# Patient Record
Sex: Female | Born: 1945 | Race: White | Hispanic: No | Smoking: Former smoker
Health system: Southern US, Community
[De-identification: ages and names within clinical notes are randomized; demographics above are authoritative.]

## PROBLEM LIST (undated history)

## (undated) DIAGNOSIS — I499 Cardiac arrhythmia, unspecified: Secondary | ICD-10-CM

## (undated) DIAGNOSIS — R011 Cardiac murmur, unspecified: Secondary | ICD-10-CM

## (undated) DIAGNOSIS — E785 Hyperlipidemia, unspecified: Secondary | ICD-10-CM

## (undated) DIAGNOSIS — G4700 Insomnia, unspecified: Secondary | ICD-10-CM

## (undated) DIAGNOSIS — M1990 Unspecified osteoarthritis, unspecified site: Secondary | ICD-10-CM

## (undated) DIAGNOSIS — Z85828 Personal history of other malignant neoplasm of skin: Secondary | ICD-10-CM

## (undated) HISTORY — PX: APPENDECTOMY: SHX54

## (undated) HISTORY — PX: TONSILLECTOMY: SUR1361

---

## 2005-03-20 ENCOUNTER — Ambulatory Visit: Payer: Self-pay | Admitting: Family Medicine

## 2007-07-28 ENCOUNTER — Ambulatory Visit: Payer: Self-pay | Admitting: Family Medicine

## 2010-08-07 ENCOUNTER — Ambulatory Visit: Payer: Self-pay | Admitting: Family Medicine

## 2012-09-28 ENCOUNTER — Ambulatory Visit: Payer: Self-pay | Admitting: Family Medicine

## 2013-07-11 ENCOUNTER — Ambulatory Visit: Payer: Self-pay | Admitting: Physical Medicine and Rehabilitation

## 2013-10-17 ENCOUNTER — Other Ambulatory Visit: Payer: Self-pay | Admitting: Orthopedic Surgery

## 2013-10-24 ENCOUNTER — Other Ambulatory Visit: Payer: Self-pay | Admitting: Orthopedic Surgery

## 2013-10-24 ENCOUNTER — Encounter (HOSPITAL_COMMUNITY): Payer: Self-pay | Admitting: Pharmacy Technician

## 2013-10-24 NOTE — H&P (Signed)
Chloe Snyder is an 67 y.o. female.   Chief Complaint: back and left leg pain HPI: The patient reports low back symptoms including pain which began 4 month(s) ago without any known injury. and Symptoms include pain (left low back and buttock). The pain radiates to the left lateral thigh and left lateral lower leg. The patient describes the severity of their symptoms as severe. Symptoms are exacerbated by standing (and walking). Prior to being seen today the patient was previously evaluated by Dr. Chasnis, pain management with [facility] in [place]. Past evaluation has included MRI of the lumbar spine. Past treatment has included opioid analgesics (Hydrocodone 7.5/325mg), corticosteroids, epidural injections (@ L4-L5. The patient states that she did not get any relief from the injection.), ice packs, heating pad, chiropractic manipulation and acupuncture.  She presents here with his daughter who is a workman's compensation adjuster. She has had an MRI of her back indicating a disk herniation paracentral to the left with mass effect upon the L4 nerve root. She reports pain that radiates down into the top of her foot. She had an epidural steroid injection that apparently has given her no relief. She is also taking pain medicine. It does not help. She would like her leg pain to be fixed. She has tried physical therapy in terms of chiropractic Acupuncture. The patient's pain range is organic. She rates her pain as 8/10.  No past medical history on file.  No past surgical history on file.  No family history on file. Social History:  has no tobacco, alcohol, and drug history on file.  Allergies: No Known Allergies   (Not in a hospital admission)  No results found for this or any previous visit (from the past 48 hour(s)). No results found.  Review of Systems  Constitutional: Negative.   HENT: Negative.   Eyes: Negative.   Respiratory: Negative.   Cardiovascular: Negative.    Gastrointestinal: Negative.   Genitourinary: Negative.   Musculoskeletal: Positive for back pain.  Skin: Negative.   Neurological: Positive for sensory change and focal weakness.  Endo/Heme/Allergies: Negative.   Psychiatric/Behavioral: Negative.     There were no vitals taken for this visit. Physical Exam  Constitutional: She is oriented to person, place, and time. She appears well-developed and well-nourished.  HENT:  Head: Normocephalic and atraumatic.  Eyes: Conjunctivae and EOM are normal. Pupils are equal, round, and reactive to light.  Neck: Normal range of motion. Neck supple.  Cardiovascular: Normal rate and regular rhythm.   Respiratory: Effort normal and breath sounds normal.  GI: Soft. Bowel sounds are normal.  Musculoskeletal:  Upright, severe distress. Mood and affect are appropriate. Straight leg raise with buttock, thigh, and calf pain on the left, negative on the right exacerbated with gross augmentation maneuver. EHL and dorsiflexion is 4/5. Slightly altered sensation L5 dermatome. Slight quadriceps weakness is noted as well. Pain with extension and flexion.  Lumbar spine exam reveals no evidence of soft tissue swelling, deformity or skin ecchymosis. On palpation there is no tenderness of the lumbar spine. The abdomen is soft and nontender. Nontender over the trochanters. No cellulitis or lymphadenopathy.  Good range of motion of the lumbar spine without associated pain. Motor is 5/5 including tibialis anterior, plantar flexion, quadriceps and hamstrings. Patient is normoreflexic. There is no Babinski or clonus. Sensory exam is intact to light touch. Patient has good distal pulses. No DVT. No pain and normal range of motion without instability of the hips, knees and ankles.  Neurological: She   is alert and oriented to person, place, and time. She has normal reflexes.  Skin: Skin is warm and dry.    Three views of the lumbar spine, AP, lateral flexion and extension  demonstrates disk degeneration particularly at 4-5, also at 5-1. No instability in flexion and extension. Hips are unremarkable.  MRI - paracentral disk herniation, disk degeneration pressing on the 4-5 roots.  Assessment/Plan 1. 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 2. Tobacco dependence.  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 Reviewed the MRI, x-rays, illustrated handouts with she and her daughter. Certainly an option at this point in time would be a lumbar microdecompression. However my concern is the duration of the neurocompression in the presence of neurologic deficit and may get no relief and/or facing a battered root syndrome. However certainly she still has a neurocompressive lesion and has made no progress. Proceeding then is reasonable. Another concern is tobacco use. Indicated dilatory side effects including scar tissue, infection, recurrent disk herniation, persistent pain. She indicates she would not quit and has not in the past. I feel that again it would be imperative and I strongly encourage her to commit as the outcome especially with the epidural fibrosis and the inability to readdress that surgically is a high risk. We won't proceed accordingly. Need preoperative clearance. No chest pain or shortness of breath. I appreciate the kind referral.  I had an extensive discussion of the risks and benefits of the lumbar decompression with the patient including bleeding, infection, damage to neurovascular structures, epidural fibrosis, CSF leak requiring repair. We also discussed increase in pain, adjacent segment disease, recurrent disc herniation, need for future surgery including repeat decompression and/or fusion. We also discussed risks of  postoperative hematoma, paralysis, anesthetic complications including DVT, PE, death, cardiopulmonary dysfunction. In addition, the perioperative and postoperative courses were discussed in detail including the rehabilitative time and return to functional activity and work. I provided the patient with an illustrated handout and utilized the appropriate surgical models.  Plan microlumbar decompression L4-5  BISSELL, JACLYN M. for Dr. Beane 10/24/2013, 2:42 PM    

## 2013-10-27 ENCOUNTER — Encounter (HOSPITAL_COMMUNITY)
Admission: RE | Admit: 2013-10-27 | Discharge: 2013-10-27 | Disposition: A | Discharge status: Home / Self Care | Payer: Medicare FFS | Source: Ambulatory Visit | Attending: Specialist | Admitting: Specialist

## 2013-10-27 ENCOUNTER — Ambulatory Visit (HOSPITAL_COMMUNITY)
Admission: RE | Admit: 2013-10-27 | Discharge: 2013-10-27 | Disposition: A | Discharge status: Home / Self Care | Payer: Medicare FFS | Source: Ambulatory Visit | Attending: Orthopedic Surgery | Admitting: Orthopedic Surgery

## 2013-10-27 ENCOUNTER — Encounter (HOSPITAL_COMMUNITY): Payer: Self-pay

## 2013-10-27 ENCOUNTER — Encounter (INDEPENDENT_AMBULATORY_CARE_PROVIDER_SITE_OTHER): Payer: Self-pay

## 2013-10-27 DIAGNOSIS — M948X9 Other specified disorders of cartilage, unspecified sites: Secondary | ICD-10-CM | POA: Insufficient documentation

## 2013-10-27 DIAGNOSIS — I700 Atherosclerosis of aorta: Secondary | ICD-10-CM | POA: Insufficient documentation

## 2013-10-27 DIAGNOSIS — I7090 Unspecified atherosclerosis: Secondary | ICD-10-CM | POA: Insufficient documentation

## 2013-10-27 DIAGNOSIS — M5126 Other intervertebral disc displacement, lumbar region: Secondary | ICD-10-CM | POA: Insufficient documentation

## 2013-10-27 DIAGNOSIS — M51379 Other intervertebral disc degeneration, lumbosacral region without mention of lumbar back pain or lower extremity pain: Secondary | ICD-10-CM | POA: Insufficient documentation

## 2013-10-27 DIAGNOSIS — Z01812 Encounter for preprocedural laboratory examination: Secondary | ICD-10-CM | POA: Insufficient documentation

## 2013-10-27 DIAGNOSIS — Z01818 Encounter for other preprocedural examination: Secondary | ICD-10-CM | POA: Insufficient documentation

## 2013-10-27 DIAGNOSIS — M5137 Other intervertebral disc degeneration, lumbosacral region: Secondary | ICD-10-CM | POA: Insufficient documentation

## 2013-10-27 HISTORY — DX: Unspecified osteoarthritis, unspecified site: M19.90

## 2013-10-27 HISTORY — DX: Cardiac arrhythmia, unspecified: I49.9

## 2013-10-27 HISTORY — DX: Hyperlipidemia, unspecified: E78.5

## 2013-10-27 HISTORY — DX: Insomnia, unspecified: G47.00

## 2013-10-27 HISTORY — DX: Personal history of other malignant neoplasm of skin: Z85.828

## 2013-10-27 HISTORY — DX: Cardiac murmur, unspecified: R01.1

## 2013-10-27 LAB — CBC
HCT: 41.5 % (ref 36.0–46.0)
Hemoglobin: 14.2 g/dL (ref 12.0–15.0)
MCH: 31.8 pg (ref 26.0–34.0)
MCHC: 34.2 g/dL (ref 30.0–36.0)
MCV: 93 fL (ref 78.0–100.0)
Platelets: 235 10*3/uL (ref 150–400)
RBC: 4.46 MIL/uL (ref 3.87–5.11)
RDW: 12.1 % (ref 11.5–15.5)
WBC: 7.1 10*3/uL (ref 4.0–10.5)

## 2013-10-27 LAB — SURGICAL PCR SCREEN
MRSA, PCR: NEGATIVE
STAPHYLOCOCCUS AUREUS: NEGATIVE

## 2013-10-27 LAB — BASIC METABOLIC PANEL
BUN: 8 mg/dL (ref 6–23)
CO2: 26 mEq/L (ref 19–32)
CREATININE: 0.86 mg/dL (ref 0.50–1.10)
Calcium: 9.4 mg/dL (ref 8.4–10.5)
Chloride: 102 mEq/L (ref 96–112)
GFR, EST AFRICAN AMERICAN: 80 mL/min — AB (ref >90)
GFR, EST NON AFRICAN AMERICAN: 69 mL/min — AB (ref >90)
Glucose, Bld: 102 mg/dL — ABNORMAL HIGH (ref 70–99)
Potassium: 4.3 mEq/L (ref 3.7–5.3)
Sodium: 140 mEq/L (ref 137–147)

## 2013-10-27 NOTE — Patient Instructions (Signed)
Chloe Snyder  04/01/2092                           YOUR PROCEDURE IS SCHEDULED ON: 11/01/13               PLEASE REPORT TO SHORT STAY CENTER AT : 8:30 AM               CALL THIS NUMBER IF ANY PROBLEMS THE DAY OF SURGERY :               [phone]                      REMEMBER:   Do not eat food or drink liquids AFTER MIDNIGHT   Take these medicines the morning of surgery with A SIP OF WATER:  CRESTOR   Do not wear jewelry, make-up   Do not wear lotions, powders, or perfumes.   Do not shave legs or underarms 12 hrs. before surgery (men may shave face)  Do not bring valuables to the hospital.  Contacts, dentures or bridgework may not be worn into surgery.  Leave suitcase in the car. After surgery it may be brought to your room.  For patients admitted to the hospital more than one night, checkout time is 11:00                          The day of discharge.   Patients discharged the day of surgery will not be allowed to drive home                             If going home same day of surgery, must have someone stay with you first                           24 hrs at home and arrange for some one to drive you home from hospital.    Special Instructions:   Please read over the following fact sheets that you were given:               1. MRSA  INFORMATION                      2. [place]                                                X_____________________________________________________________________        Failure to follow these instructions may result in cancellation of your surgery

## 2013-10-27 NOTE — Progress Notes (Signed)
EKG / ECHO / CARDIOLOGY NOTES ON CHART

## 2013-11-01 ENCOUNTER — Encounter (HOSPITAL_COMMUNITY): Payer: Self-pay | Admitting: *Deleted

## 2013-11-01 ENCOUNTER — Observation Stay (HOSPITAL_COMMUNITY)
Admission: RE | Admit: 2013-11-01 | Discharge: 2013-11-02 | Disposition: A | Discharge status: Home / Self Care | Payer: Medicare FFS | Source: Ambulatory Visit | Attending: Specialist | Admitting: Specialist

## 2013-11-01 ENCOUNTER — Ambulatory Visit (HOSPITAL_COMMUNITY): Payer: Medicare FFS

## 2013-11-01 ENCOUNTER — Encounter (HOSPITAL_COMMUNITY)
Admission: RE | Disposition: A | Discharge status: Home / Self Care | Payer: Self-pay | Source: Ambulatory Visit | Attending: Specialist

## 2013-11-01 ENCOUNTER — Ambulatory Visit (HOSPITAL_COMMUNITY): Payer: Medicare FFS | Admitting: Anesthesiology

## 2013-11-01 ENCOUNTER — Encounter (HOSPITAL_COMMUNITY): Payer: Medicare FFS | Admitting: Anesthesiology

## 2013-11-01 DIAGNOSIS — IMO0002 Reserved for concepts with insufficient information to code with codable children: Secondary | ICD-10-CM | POA: Insufficient documentation

## 2013-11-01 DIAGNOSIS — Z87891 Personal history of nicotine dependence: Secondary | ICD-10-CM | POA: Insufficient documentation

## 2013-11-01 DIAGNOSIS — M48062 Spinal stenosis, lumbar region with neurogenic claudication: Secondary | ICD-10-CM

## 2013-11-01 DIAGNOSIS — M5126 Other intervertebral disc displacement, lumbar region: Principal | ICD-10-CM | POA: Diagnosis present

## 2013-11-01 HISTORY — PX: LUMBAR LAMINECTOMY/DECOMPRESSION MICRODISCECTOMY: SHX5026

## 2013-11-01 SURGERY — LUMBAR LAMINECTOMY/DECOMPRESSION MICRODISCECTOMY 1 LEVEL
Anesthesia: General | Site: Back

## 2013-11-01 MED ORDER — MIDAZOLAM HCL 5 MG/5ML IJ SOLN
INTRAMUSCULAR | Status: DC | PRN
  Administered 2013-11-01 (×2): 1 mg via INTRAVENOUS

## 2013-11-01 MED ORDER — MIDAZOLAM HCL 2 MG/2ML IJ SOLN
INTRAMUSCULAR | Status: AC
  Filled 2013-11-01: qty 2

## 2013-11-01 MED ORDER — PROPOFOL 10 MG/ML IV BOLUS
INTRAVENOUS | Status: AC
  Filled 2013-11-01: qty 20

## 2013-11-01 MED ORDER — HYDROMORPHONE HCL PF 1 MG/ML IJ SOLN: 0.2500 mg | INTRAMUSCULAR | Status: DC | PRN

## 2013-11-01 MED ORDER — CEFAZOLIN SODIUM-DEXTROSE 2-3 GM-% IV SOLR
2.0000 g | INTRAVENOUS | Status: AC
  Administered 2013-11-01: 2 g via INTRAVENOUS

## 2013-11-01 MED ORDER — CHLORHEXIDINE GLUCONATE 4 % EX LIQD: 60.0000 mL | Freq: Once | CUTANEOUS | Status: DC

## 2013-11-01 MED ORDER — ONDANSETRON HCL 4 MG/2ML IJ SOLN
INTRAMUSCULAR | Status: DC | PRN
  Administered 2013-11-01: 4 mg via INTRAVENOUS

## 2013-11-01 MED ORDER — ACETAMINOPHEN 650 MG RE SUPP: 650.0000 mg | RECTAL | Status: DC | PRN

## 2013-11-01 MED ORDER — CEFAZOLIN SODIUM-DEXTROSE 2-3 GM-% IV SOLR
2.0000 g | Freq: Three times a day (TID) | INTRAVENOUS | Status: AC
  Administered 2013-11-01 – 2013-11-02 (×2): 2 g via INTRAVENOUS
  Filled 2013-11-01 (×2): qty 50

## 2013-11-01 MED ORDER — BUPIVACAINE-EPINEPHRINE PF 0.5-1:200000 % IJ SOLN
INTRAMUSCULAR | Status: AC
  Filled 2013-11-01: qty 30

## 2013-11-01 MED ORDER — DEXAMETHASONE SODIUM PHOSPHATE 10 MG/ML IJ SOLN
INTRAMUSCULAR | Status: DC | PRN
  Administered 2013-11-01: 10 mg via INTRAVENOUS

## 2013-11-01 MED ORDER — FENTANYL CITRATE 0.05 MG/ML IJ SOLN
INTRAMUSCULAR | Status: DC | PRN
  Administered 2013-11-01 (×2): 25 ug via INTRAVENOUS
  Administered 2013-11-01 (×2): 50 ug via INTRAVENOUS
  Administered 2013-11-01 (×2): 25 ug via INTRAVENOUS

## 2013-11-01 MED ORDER — FENTANYL CITRATE 0.05 MG/ML IJ SOLN
INTRAMUSCULAR | Status: AC
  Filled 2013-11-01: qty 5

## 2013-11-01 MED ORDER — SUCCINYLCHOLINE CHLORIDE 20 MG/ML IJ SOLN
INTRAMUSCULAR | Status: DC | PRN
  Administered 2013-11-01: 100 mg via INTRAVENOUS

## 2013-11-01 MED ORDER — ONDANSETRON HCL 4 MG/2ML IJ SOLN
INTRAMUSCULAR | Status: AC
  Filled 2013-11-01: qty 2

## 2013-11-01 MED ORDER — LACTATED RINGERS IV SOLN
INTRAVENOUS | Status: DC
  Administered 2013-11-01 (×2): via INTRAVENOUS

## 2013-11-01 MED ORDER — ZOLPIDEM TARTRATE 5 MG PO TABS
5.0000 mg | ORAL_TABLET | Freq: Every day | ORAL | Status: DC
  Administered 2013-11-01: 5 mg via ORAL
  Filled 2013-11-01: qty 1

## 2013-11-01 MED ORDER — PHENOL 1.4 % MT LIQD: 1.0000 | OROMUCOSAL | Status: DC | PRN

## 2013-11-01 MED ORDER — ONDANSETRON HCL 4 MG/2ML IJ SOLN: 4.0000 mg | INTRAMUSCULAR | Status: DC | PRN

## 2013-11-01 MED ORDER — LIDOCAINE HCL (CARDIAC) 20 MG/ML IV SOLN
INTRAVENOUS | Status: DC | PRN
  Administered 2013-11-01: 75 mg via INTRAVENOUS

## 2013-11-01 MED ORDER — ACETAMINOPHEN 325 MG PO TABS: 650.0000 mg | ORAL_TABLET | ORAL | Status: DC | PRN

## 2013-11-01 MED ORDER — SODIUM CHLORIDE 0.9 % IJ SOLN: 3.0000 mL | INTRAMUSCULAR | Status: DC | PRN

## 2013-11-01 MED ORDER — SODIUM CHLORIDE 0.9 % IJ SOLN
3.0000 mL | Freq: Two times a day (BID) | INTRAMUSCULAR | Status: DC
  Administered 2013-11-01: 3 mL via INTRAVENOUS

## 2013-11-01 MED ORDER — DEXAMETHASONE SODIUM PHOSPHATE 10 MG/ML IJ SOLN
INTRAMUSCULAR | Status: AC
  Filled 2013-11-01: qty 1

## 2013-11-01 MED ORDER — DOCUSATE SODIUM 100 MG PO CAPS
100.0000 mg | ORAL_CAPSULE | Freq: Two times a day (BID) | ORAL | Status: DC
  Administered 2013-11-01 – 2013-11-02 (×2): 100 mg via ORAL

## 2013-11-01 MED ORDER — BUPIVACAINE-EPINEPHRINE 0.5% -1:200000 IJ SOLN
INTRAMUSCULAR | Status: DC | PRN
  Administered 2013-11-01: 12 mL

## 2013-11-01 MED ORDER — CISATRACURIUM BESYLATE (PF) 10 MG/5ML IV SOLN
INTRAVENOUS | Status: DC | PRN
  Administered 2013-11-01: 2 mg via INTRAVENOUS
  Administered 2013-11-01: 6 mg via INTRAVENOUS
  Administered 2013-11-01: 2 mg via INTRAVENOUS

## 2013-11-01 MED ORDER — OXYCODONE-ACETAMINOPHEN 5-325 MG PO TABS
1.0000 | ORAL_TABLET | ORAL | Status: DC | PRN
  Administered 2013-11-01 – 2013-11-02 (×5): 1 via ORAL
  Filled 2013-11-01 (×2): qty 1
  Filled 2013-11-01: qty 2
  Filled 2013-11-01: qty 1

## 2013-11-01 MED ORDER — CISATRACURIUM BESYLATE 20 MG/10ML IV SOLN
INTRAVENOUS | Status: AC
  Filled 2013-11-01: qty 10

## 2013-11-01 MED ORDER — MENTHOL 3 MG MT LOZG
1.0000 | LOZENGE | OROMUCOSAL | Status: DC | PRN
  Filled 2013-11-01: qty 9

## 2013-11-01 MED ORDER — POLYMYXIN B SULFATE 500000 UNITS IJ SOLR
INTRAMUSCULAR | Status: DC | PRN
  Administered 2013-11-01: 13:00:00

## 2013-11-01 MED ORDER — GLYCOPYRROLATE 0.2 MG/ML IJ SOLN
INTRAMUSCULAR | Status: DC | PRN
  Administered 2013-11-01: .8 mg via INTRAVENOUS

## 2013-11-01 MED ORDER — EPHEDRINE SULFATE 50 MG/ML IJ SOLN
INTRAMUSCULAR | Status: DC | PRN
  Administered 2013-11-01 (×2): 5 mg via INTRAVENOUS

## 2013-11-01 MED ORDER — PROPOFOL 10 MG/ML IV BOLUS
INTRAVENOUS | Status: DC | PRN
  Administered 2013-11-01: 120 mg via INTRAVENOUS

## 2013-11-01 MED ORDER — CEFAZOLIN SODIUM-DEXTROSE 2-3 GM-% IV SOLR
INTRAVENOUS | Status: AC
  Filled 2013-11-01: qty 50

## 2013-11-01 MED ORDER — SODIUM CHLORIDE 0.9 % IV SOLN: 250.0000 mL | INTRAVENOUS | Status: DC

## 2013-11-01 MED ORDER — THROMBIN 5000 UNITS EX SOLR
CUTANEOUS | Status: DC | PRN
Start: 2013-11-01 — End: 2013-11-01
  Administered 2013-11-01 (×2): via TOPICAL

## 2013-11-01 MED ORDER — NEOSTIGMINE METHYLSULFATE 1 MG/ML IJ SOLN
INTRAMUSCULAR | Status: DC | PRN
  Administered 2013-11-01: 5 mg via INTRAVENOUS

## 2013-11-01 MED ORDER — GLYCOPYRROLATE 0.2 MG/ML IJ SOLN
INTRAMUSCULAR | Status: AC
  Filled 2013-11-01: qty 3

## 2013-11-01 MED ORDER — NEOSTIGMINE METHYLSULFATE 1 MG/ML IJ SOLN
INTRAMUSCULAR | Status: AC
  Filled 2013-11-01: qty 10

## 2013-11-01 MED ORDER — HYDROCODONE-ACETAMINOPHEN 5-325 MG PO TABS: 1.0000 | ORAL_TABLET | ORAL | Status: DC | PRN

## 2013-11-01 MED ORDER — THROMBIN 5000 UNITS EX SOLR
CUTANEOUS | Status: AC
  Filled 2013-11-01: qty 10000

## 2013-11-01 MED ORDER — LIDOCAINE HCL (CARDIAC) 20 MG/ML IV SOLN
INTRAVENOUS | Status: AC
  Filled 2013-11-01: qty 5

## 2013-11-01 MED ORDER — HYDROMORPHONE HCL PF 1 MG/ML IJ SOLN
0.5000 mg | INTRAMUSCULAR | Status: DC | PRN
  Administered 2013-11-01: 0.5 mg via INTRAVENOUS
  Filled 2013-11-01: qty 1

## 2013-11-01 MED ORDER — PROMETHAZINE HCL 25 MG/ML IJ SOLN: 6.2500 mg | INTRAMUSCULAR | Status: DC | PRN

## 2013-11-01 MED ORDER — HYDROCODONE-ACETAMINOPHEN 7.5-325 MG PO TABS: 1.0000 | ORAL_TABLET | ORAL | Status: DC | PRN

## 2013-11-01 MED ORDER — SODIUM CHLORIDE 0.45 % IV SOLN
INTRAVENOUS | Status: DC
  Administered 2013-11-01: 16:00:00 via INTRAVENOUS

## 2013-11-01 SURGICAL SUPPLY — 46 items
BAG ZIPLOCK 12X15 (MISCELLANEOUS) IMPLANT
CHLORAPREP W/TINT 26ML (MISCELLANEOUS) IMPLANT
CLEANER TIP ELECTROSURG 2X2 (MISCELLANEOUS) ×3 IMPLANT
CLOSURE WOUND 1/2 X4 (GAUZE/BANDAGES/DRESSINGS) ×1
CLOTH 2% CHLOROHEXIDINE 3PK (PERSONAL CARE ITEMS) ×3 IMPLANT
DECANTER SPIKE VIAL GLASS SM (MISCELLANEOUS) ×3 IMPLANT
DRAPE MICROSCOPE LEICA (MISCELLANEOUS) ×3 IMPLANT
DRAPE POUCH INSTRU U-SHP 10X18 (DRAPES) ×3 IMPLANT
DRAPE SURG 17X11 SM STRL (DRAPES) ×3 IMPLANT
DRAPE UTILITY XL STRL (DRAPES) ×3 IMPLANT
DRSG AQUACEL AG ADV 3.5X 4 (GAUZE/BANDAGES/DRESSINGS) IMPLANT
DRSG AQUACEL AG ADV 3.5X 6 (GAUZE/BANDAGES/DRESSINGS) ×3 IMPLANT
DURAPREP 26ML APPLICATOR (WOUND CARE) ×3 IMPLANT
DURASEAL SPINE SEALANT 3ML (MISCELLANEOUS) IMPLANT
ELECT BLADE TIP CTD 4 INCH (ELECTRODE) IMPLANT
ELECT REM PT RETURN 9FT ADLT (ELECTROSURGICAL) ×3
ELECTRODE REM PT RTRN 9FT ADLT (ELECTROSURGICAL) ×1 IMPLANT
GLOVE BIOGEL PI IND STRL 7.5 (GLOVE) ×1 IMPLANT
GLOVE BIOGEL PI INDICATOR 7.5 (GLOVE) ×2
GLOVE SURG SS PI 7.5 STRL IVOR (GLOVE) ×3 IMPLANT
GLOVE SURG SS PI 8.0 STRL IVOR (GLOVE) ×6 IMPLANT
GOWN STRL REUS W/TWL XL LVL3 (GOWN DISPOSABLE) ×6 IMPLANT
IV CATH 14GX2 1/4 (CATHETERS) ×3 IMPLANT
KIT BASIN OR (CUSTOM PROCEDURE TRAY) ×3 IMPLANT
KIT POSITIONING SURG ANDREWS (MISCELLANEOUS) ×3 IMPLANT
MANIFOLD NEPTUNE II (INSTRUMENTS) ×3 IMPLANT
NEEDLE SPNL 18GX3.5 QUINCKE PK (NEEDLE) ×6 IMPLANT
PATTIES SURGICAL .5 X.5 (GAUZE/BANDAGES/DRESSINGS) IMPLANT
PATTIES SURGICAL .75X.75 (GAUZE/BANDAGES/DRESSINGS) IMPLANT
PATTIES SURGICAL 1X1 (DISPOSABLE) IMPLANT
SPONGE SURGIFOAM ABS GEL 100 (HEMOSTASIS) ×3 IMPLANT
STAPLER VISISTAT (STAPLE) IMPLANT
STRIP CLOSURE SKIN 1/2X4 (GAUZE/BANDAGES/DRESSINGS) ×2 IMPLANT
SUT NURALON 4 0 TR CR/8 (SUTURE) IMPLANT
SUT PROLENE 3 0 PS 2 (SUTURE) ×3 IMPLANT
SUT VIC AB 1 CT1 27 (SUTURE)
SUT VIC AB 1 CT1 27XBRD ANTBC (SUTURE) IMPLANT
SUT VIC AB 1-0 CT2 27 (SUTURE) ×3 IMPLANT
SUT VIC AB 2-0 CT1 27 (SUTURE)
SUT VIC AB 2-0 CT1 TAPERPNT 27 (SUTURE) IMPLANT
SUT VIC AB 2-0 CT2 27 (SUTURE) ×3 IMPLANT
SYR 3ML LL SCALE MARK (SYRINGE) ×3 IMPLANT
TOWEL OR 17X26 10 PK STRL BLUE (TOWEL DISPOSABLE) ×3 IMPLANT
TOWEL OR NON WOVEN STRL DISP B (DISPOSABLE) IMPLANT
TRAY LAMINECTOMY (CUSTOM PROCEDURE TRAY) ×3 IMPLANT
YANKAUER SUCT BULB TIP NO VENT (SUCTIONS) ×3 IMPLANT

## 2013-11-01 NOTE — Care Management Note (Signed)
   CARE MANAGEMENT NOTE 11/01/2013  Patient:  Chloe Snyder, Chloe Snyder   Account Number:  [id number]  Date Initiated:  11/01/2013  Documentation initiated by:  Nicholad Kautzman  Subjective/Objective Assessment:   68 yo female admitted with lumbar stenosis. S/P MICRODISCECTOMY LUMBAR DECOMPRESSION L4-L5   ( 1 LEVEL     Action/Plan:   Home when stable   Anticipated DC Date:     Anticipated DC Plan:  [place]  CM consult      Choice offered to / List presented to:  NA   DME arranged  NA      DME agency  NA     [place] arranged  NA      [place] agency  NA   Status of service:  Completed, signed off Medicare Important Message given?   (If response is "NO", the following Medicare IM given date fields will be blank) Date Medicare IM given:   Date Additional Medicare IM given:    Discharge Disposition:    Per UR Regulation:  Reviewed for med. necessity/level of care/duration of stay  If discussed at [place] of Stay Meetings, dates discussed:    Comments:  11/01/13 [place] Erique Kaser,MSN,RN [phone] UR complete

## 2013-11-01 NOTE — Anesthesia Preprocedure Evaluation (Signed)
Anesthesia Evaluation  Patient identified by MRN, date of birth, ID band Patient awake    Reviewed: Allergy & Precautions, H&P , NPO status , Patient's Chart, lab work & pertinent test results  Airway Mallampati: II TM Distance: >3 FB Neck ROM: Full    Dental no notable dental hx.    Pulmonary Current Smoker,  breath sounds clear to auscultation  Pulmonary exam normal       Cardiovascular + dysrhythmias + Valvular Problems/Murmurs Rhythm:Regular Rate:Normal     Neuro/Psych negative neurological ROS  negative psych ROS   GI/Hepatic negative GI ROS, Neg liver ROS,   Endo/Other  negative endocrine ROS  Renal/GU negative Renal ROS  negative genitourinary   Musculoskeletal negative musculoskeletal ROS (+)   Abdominal   Peds negative pediatric ROS (+)  Hematology negative hematology ROS (+)   Anesthesia Other Findings   Reproductive/Obstetrics negative OB ROS                           Anesthesia Physical Anesthesia Plan  ASA: II  Anesthesia Plan: General   Post-op Pain Management:    Induction: Intravenous  Airway Management Planned: Oral ETT  Additional Equipment:   Intra-op Plan:   Post-operative Plan: Extubation in OR  Informed Consent: I have reviewed the patients History and Physical, chart, labs and discussed the procedure including the risks, benefits and alternatives for the proposed anesthesia with the patient or authorized representative who has indicated his/her understanding and acceptance.   Dental advisory given  Plan Discussed with: CRNA  Anesthesia Plan Comments:         Anesthesia Quick Evaluation

## 2013-11-01 NOTE — Transfer of Care (Signed)
Immediate Anesthesia Transfer of Care Note  Patient: Chloe Snyder  Procedure(s) Performed: Procedure(s):  MICRODISCECTOMY LUMBAR DECOMPRESSION L4-L5   ( 1 LEVEL) (N/A)  Patient Location: PACU  Anesthesia Type:General  Level of Consciousness: pateint uncooperative and confused  Airway & Oxygen Therapy: Patient Spontanous Breathing and Patient connected to face mask oxygen  Post-op Assessment: Report given to PACU RN, Post -op Vital signs reviewed and stable and Patient moving all extremities  Post vital signs: Reviewed and stable  Complications: No apparent anesthesia complications

## 2013-11-01 NOTE — Interval H&P Note (Signed)
History and Physical Interval Note:  11/01/2013 8:28 AM  Chloe Snyder  has presented today for surgery, with the diagnosis of HNP AND STENOSIS   The various methods of treatment have been discussed with the patient and family. After consideration of risks, benefits and other options for treatment, the patient has consented to  Procedure(s):  MICRODISCECTOMY LUMBAR DECOMPRESSION L4-L5   ( 1 LEVEL) (N/A) as a surgical intervention .  The patient's history has been reviewed, patient examined, no change in status, stable for surgery.  I have reviewed the patient's chart and labs.  Questions were answered to the patient's satisfaction.     Nikkita Adeyemi C

## 2013-11-01 NOTE — Plan of Care (Signed)
Problem: Consults Goal: Diagnosis - Spinal Surgery Outcome: Completed/Met Date Met:  11/01/13 Microdiscectomy  Microdiscectomy Lumbar Decomp. L4-L5 (1 level)

## 2013-11-01 NOTE — Anesthesia Postprocedure Evaluation (Signed)
  Anesthesia Post-op Note  Patient: Chloe Snyder  Procedure(s) Performed: Procedure(s) (LRB):  MICRODISCECTOMY LUMBAR DECOMPRESSION L4-L5   ( 1 LEVEL) (N/A)  Patient Location: PACU  Anesthesia Type: General  Level of Consciousness: awake and alert   Airway and Oxygen Therapy: Patient Spontanous Breathing  Post-op Pain: mild  Post-op Assessment: Post-op Vital signs reviewed, Patient's Cardiovascular Status Stable, Respiratory Function Stable, Patent Airway and No signs of Nausea or vomiting  Last Vitals:  Filed Vitals:   11/01/13 1430  BP:   Pulse:   Temp: 36.1 C  Resp:     Post-op Vital Signs: stable   Complications: No apparent anesthesia complications

## 2013-11-01 NOTE — Discharge Instructions (Signed)
Walk As Tolerated utilizing back precautions.  No bending, twisting, or lifting.  No driving for 2 weeks.   Aquacel dressing may remain in place for 7 days. May shower with aquacel dressing in place. After 7 days, remove aquacel dressing and place gauze and tape dressing which should be kept clean and dry and changed daily. See Dr. Tonita Cong in office in 10 to 14 days. Begin taking aspirin 81mg  per day starting 4 days after your surgery if not allergic to aspirin or on another blood thinner. Walk daily even outside. Use a cane or walker only if necessary. Avoid sitting on soft sofas.

## 2013-11-01 NOTE — Anesthesia Procedure Notes (Addendum)
Procedure Name: Intubation Date/Time: 11/01/2013 11:08 AM Performed by: Dion Saucier E Patient Re-evaluated:Patient Re-evaluated prior to inductionOxygen Delivery Method: Circle system utilized Preoxygenation: Pre-oxygenation with 100% oxygen Intubation Type: IV induction Ventilation: Mask ventilation without difficulty Laryngoscope Size: Mac and 3 Grade View: Grade II Tube size: 7.0 mm Number of attempts: 2 Airway Equipment and Method: Stylet Placement Confirmation: ETT inserted through vocal cords under direct vision,  positive ETCO2 and breath sounds checked- equal and bilateral Secured at: 21 cm Tube secured with: Tape Dental Injury: Teeth and Oropharynx as per pre-operative assessment  Comments: 2 lower front teeth noted to be loose prior to intubation

## 2013-11-01 NOTE — H&P (View-Only) (Signed)
Chloe Snyder is an 68 y.o. female.   Chief Complaint: back and left leg pain HPI: The patient reports low back symptoms including pain which began 4 month(s) ago without any known injury. and Symptoms include pain (left low back and buttock). The pain radiates to the left lateral thigh and left lateral lower leg. The patient describes the severity of their symptoms as severe. Symptoms are exacerbated by standing (and walking). Prior to being seen today the patient was previously evaluated by Dr. Sharlet Salina, pain management with [facility] in [place]. Past evaluation has included MRI of the lumbar spine. Past treatment has included opioid analgesics (Hydrocodone 7.5/325mg ), corticosteroids, epidural injections (@ L4-L5. The patient states that she did not get any relief from the injection.), ice packs, heating pad, chiropractic manipulation and acupuncture.  She presents here with his daughter who is a Art gallery manager. She has had an MRI of her back indicating a disk herniation paracentral to the left with mass effect upon the L4 nerve root. She reports pain that radiates down into the top of her foot. She had an epidural steroid injection that apparently has given her no relief. She is also taking pain medicine. It does not help. She would like her leg pain to be fixed. She has tried physical therapy in terms of chiropractic Acupuncture. The patient's pain range is organic. She rates her pain as 8/10.  No past medical history on file.  No past surgical history on file.  No family history on file. Social History:  has no tobacco, alcohol, and drug history on file.  Allergies: No Known Allergies   (Not in a hospital admission)  No results found for this or any previous visit (from the past 48 hour(s)). No results found.  Review of Systems  Constitutional: Negative.   HENT: Negative.   Eyes: Negative.   Respiratory: Negative.   Cardiovascular: Negative.    Gastrointestinal: Negative.   Genitourinary: Negative.   Musculoskeletal: Positive for back pain.  Skin: Negative.   Neurological: Positive for sensory change and focal weakness.  Endo/Heme/Allergies: Negative.   Psychiatric/Behavioral: Negative.     There were no vitals taken for this visit. Physical Exam  Constitutional: She is oriented to person, place, and time. She appears well-developed and well-nourished.  HENT:  Head: Normocephalic and atraumatic.  Eyes: Conjunctivae and EOM are normal. Pupils are equal, round, and reactive to light.  Neck: Normal range of motion. Neck supple.  Cardiovascular: Normal rate and regular rhythm.   Respiratory: Effort normal and breath sounds normal.  GI: Soft. Bowel sounds are normal.  Musculoskeletal:  Upright, severe distress. Mood and affect are appropriate. Straight leg raise with buttock, thigh, and calf pain on the left, negative on the right exacerbated with gross augmentation maneuver. EHL and dorsiflexion is 4/5. Slightly altered sensation L5 dermatome. Slight quadriceps weakness is noted as well. Pain with extension and flexion.  Lumbar spine exam reveals no evidence of soft tissue swelling, deformity or skin ecchymosis. On palpation there is no tenderness of the lumbar spine. The abdomen is soft and nontender. Nontender over the trochanters. No cellulitis or lymphadenopathy.  Good range of motion of the lumbar spine without associated pain. Motor is 5/5 including tibialis anterior, plantar flexion, quadriceps and hamstrings. Patient is normoreflexic. There is no Babinski or clonus. Sensory exam is intact to light touch. Patient has good distal pulses. No DVT. No pain and normal range of motion without instability of the hips, knees and ankles.  Neurological: She  is alert and oriented to person, place, and time. She has normal reflexes.  Skin: Skin is warm and dry.    Three views of the lumbar spine, AP, lateral flexion and extension  demonstrates disk degeneration particularly at 4-5, also at 5-1. No instability in flexion and extension. Hips are unremarkable.  MRI - paracentral disk herniation, disk degeneration pressing on the 4-5 roots.  Assessment/Plan 1. 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 2. Tobacco dependence.  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 Reviewed the MRI, x-rays, illustrated handouts with she and her daughter. Certainly an option at this point in time would be a lumbar microdecompression. However my concern is the duration of the neurocompression in the presence of neurologic deficit and may get no relief and/or facing a battered root syndrome. However certainly she still has a neurocompressive lesion and has made no progress. Proceeding then is reasonable. Another concern is tobacco use. Indicated dilatory side effects including scar tissue, infection, recurrent disk herniation, persistent pain. She indicates she would not quit and has not in the past. I feel that again it would be imperative and I strongly encourage her to commit as the outcome especially with the epidural fibrosis and the inability to readdress that surgically is a high risk. We won't proceed accordingly. Need preoperative clearance. No chest pain or shortness of breath. I appreciate the kind referral.  I had an extensive discussion of the risks and benefits of the lumbar decompression with the patient including bleeding, infection, damage to neurovascular structures, epidural fibrosis, CSF leak requiring repair. We also discussed increase in pain, adjacent segment disease, recurrent disc herniation, need for future surgery including repeat decompression and/or fusion. We also discussed risks of  postoperative hematoma, paralysis, anesthetic complications including DVT, PE, death, cardiopulmonary dysfunction. In addition, the perioperative and postoperative courses were discussed in detail including the rehabilitative time and return to functional activity and work. I provided the patient with an illustrated handout and utilized the appropriate surgical models.  Plan microlumbar decompression L4-5  Snyder, Chloe M. for Dr. Tonita Cong 10/24/2013, 2:42 PM

## 2013-11-01 NOTE — Brief Op Note (Signed)
11/01/2013  12:35 PM  PATIENT:  Chloe Snyder  68 y.o. female  PRE-OPERATIVE DIAGNOSIS:  HNP AND STENOSIS   POST-OPERATIVE DIAGNOSIS:  HNP AND STENOSIS   PROCEDURE:  Procedure(s):  MICRODISCECTOMY LUMBAR DECOMPRESSION L4-L5   ( 1 LEVEL) (N/A)  SURGEON:  Surgeon(s) and Role:    * Johnn Hai, MD - Primary  PHYSICIAN ASSISTANT:   ASSISTANTS: [place]   ANESTHESIA:   general  EBL:  Total I/O In: 1000 [I.V.:1000] Out: 100 [Blood:100]  BLOOD ADMINISTERED:none  DRAINS: none   LOCAL MEDICATIONS USED:  MARCAINE     SPECIMEN:  No Specimen  DISPOSITION OF SPECIMEN:  N/A  COUNTS:  YES  TOURNIQUET:  * No tourniquets in log *  DICTATION: .Other Dictation: Dictation Number [id number]  PLAN OF CARE: Admit for overnight observation  PATIENT DISPOSITION:  PACU - hemodynamically stable.   Delay start of Pharmacological VTE agent (>24hrs) due to surgical blood loss or risk of bleeding: yes

## 2013-11-01 NOTE — Preoperative (Signed)
Beta Blockers   Reason not to administer Beta Blockers:Not Applicable 

## 2013-11-02 ENCOUNTER — Encounter (HOSPITAL_COMMUNITY): Payer: Self-pay | Admitting: Specialist

## 2013-11-02 LAB — BASIC METABOLIC PANEL
BUN: 6 mg/dL (ref 6–23)
CALCIUM: 8.8 mg/dL (ref 8.4–10.5)
CO2: 27 mEq/L (ref 19–32)
Chloride: 101 mEq/L (ref 96–112)
Creatinine, Ser: 0.76 mg/dL (ref 0.50–1.10)
GFR calc Af Amer: >90 mL/min (ref >90)
GFR, EST NON AFRICAN AMERICAN: 86 mL/min — AB (ref >90)
GLUCOSE: 122 mg/dL — AB (ref 70–99)
POTASSIUM: 3.9 meq/L (ref 3.7–5.3)
Sodium: 137 mEq/L (ref 137–147)

## 2013-11-02 NOTE — Progress Notes (Signed)
CSW consulted for SNF placement. PN reviewed. PT is recommending no follow up. Pt will return home following hospital d/c.   Werner Lean LCSW [phone]

## 2013-11-02 NOTE — Progress Notes (Signed)
Subjective: 1 Day Post-Op Procedure(s) (LRB):  MICRODISCECTOMY LUMBAR DECOMPRESSION L4-L5   ( 1 LEVEL) (N/A) Patient reports pain as mild.  Reports incisional back pain, denies leg pain, numbness, tingling. Foley still in place. Has been OOB, tolerated well. Reports she did not sleep much overnight because of noise and not being in her own bed. She is planning on D/C to her daughter's house initially as she lives alone. Seen by myself and Dr. Tonita Cong in AM rounds  Objective: Vital signs in last 24 hours: Temp:  [97 F (36.1 C)-98.9 F (37.2 C)] 98.6 F (37 C) (01/08 0525) Pulse Rate:  [60-80] 60 (01/08 0525) Resp:  [14-21] 20 (01/08 0525) BP: (85-120)/(43-64) 96/52 mmHg (01/08 0531) SpO2:  [93 %-100 %] 95 % (01/08 0525) Weight:  [63 kg (138 lb 14.2 oz)] 63 kg (138 lb 14.2 oz) (01/07 1708)  Intake/Output from previous day: 01/07 0701 - 01/08 0700 In: 4478.3 [P.O.:600; I.V.:3878.3] Out: 2637 [Urine:1450; Blood:100] Intake/Output this shift:    No results found for this basename: HGB,  in the last 72 hours No results found for this basename: WBC, RBC, HCT, PLT,  in the last 72 hours  Recent Labs  11/02/13 0525  NA 137  K 3.9  CL 101  CO2 27  BUN 6  CREATININE 0.76  GLUCOSE 122*  CALCIUM 8.8   No results found for this basename: LABPT, INR,  in the last 72 hours  Neurologically intact ABD soft Neurovascular intact Sensation intact distally Intact pulses distally Dorsiflexion/Plantar flexion intact Incision: dressing C/D/I and no drainage No cellulitis present Compartments soft No calf pain or sign of DVT  Assessment/Plan: 1 Day Post-Op Procedure(s) (LRB):  MICRODISCECTOMY LUMBAR DECOMPRESSION L4-L5   ( 1 LEVEL) (N/A) Advance diet Up with therapy D/C IV fluids Discussed D/C instructions, dressing instructions Lspine precautions, no lifting, bending, twisting, prolonged sitting x 6 weeks Foley to be removed now, she will need to void on her own without any  difficulty prior to D/C and if she does have urinary retention will need bladder scan, straight cath prn Plan D/C home later today as long as her pain remains well controlled and no voiding difficulty Follow up 10-14 days for staple removal   Chloe Snyder M. 11/02/2013, 10:20 AM

## 2013-11-02 NOTE — Op Note (Signed)
Chloe Snyder, Chloe Snyder          ACCOUNT NO.:  [id number]  MEDICAL RECORD NO.:  [id number]  LOCATION:  [id number]                         FACILITY:  [facility]  PHYSICIAN:  Susa Day, M.D.    DATE OF BIRTH:  Aug 15, 1946  DATE OF PROCEDURE:  11/01/2013 DATE OF DISCHARGE:                              OPERATIVE REPORT   PREOPERATIVE DIAGNOSES:  Spinal stenosis, herniated nucleus pulposus at L4-5 bilaterally.  POSTOPERATIVE DIAGNOSES:  Spinal stenosis, herniated nucleus pulposus at L4-5 bilaterally.  PROCEDURES PERFORMED: 1. Bilateral hemilaminotomies at L4-5. 2. Bilateral foraminotomies of L4 and L5. 3. Microdiskectomy at L4-5.  ANESTHESIA:  General.  ASSISTANT:  Cleophas Dunker, PA.  HISTORY:  This is a pleasant female who has had lower extremity radicular pain bilaterally secondary to stenosis and HNP, migrating caudad, neural tension signs, EHL, weakness, refractory to conservative treatment, indicated for decompression bilaterally at L4-5.  Risks and benefits were discussed including bleeding, infection, damage to neurovascular structures, DVT, PE, anesthetic complications, etc.  TECHNIQUE:  With the patient in supine position, after induction of adequate general anesthesia, 2 g of Kefzol, placed prone on the [place] frame.  All bony prominences were well padded.  Lumbar region was prepped and draped in usual sterile fashion.  Two 18-gauge spinal needles were utilized to localize the L4-5 interspace, confirmed with x- ray.  Incision was made from spinous process L4 to L5.  Subcutaneous tissues were dissected.  Electrocautery was utilized to achieve hemostasis.  Dorsolumbar fascia identified and divided in line with the skin incision.  Paraspinous muscle was elevated from lamina of L4-5. McCullough retractor was placed.  Operating microscope was draped, brought on the surgical field, removed the spinous processes of L4 and L5, somewhat soft bone.  Hemilaminotomies bilateral  were performed with 2 and 3-mm Kerrison.  Detached the ligamentum flavum from the caudad edge of L4 and the cephalad edge of L5.  We used a microcurette to detach that.  Neuro patty was placed beneath the ligamentum flavum to protect the dural sac.  The rupture was off to the left.  We decompressed the right to the medial border of the pedicle, performing foraminotomies of L4 and L5.  Both were moderately stenotic.  On the left, foraminotomy of L4 was performed.  Identified the disk herniation with severe compression of the L5 root into the shoulder of the root. After protecting the shoulder of the root, I made an annulotomy and incised the posterior longitudinal ligament and removed multiple free fragments of disk material after mobilizing with a nerve hook and removing them with micropituitary.  Also into the disk space.  After removing multiple fragments, this decompressed the L5 root.  We were able to protect it and performed a foraminotomy of L5.  This was generous and then remobilized the nerve root, again explored the disk space and beneath that and inferior to that, no further disk herniation. Copiously irrigated the disk space with antibiotic irrigation. Confirmatory radiograph obtained, and 1 cm of excursion of the L5 root medial to pedicle without tension.  Thrombin-soaked Gelfoam was placed in the laminotomy defect, bone wax on the cancellous surfaces.  There was no evidence of CSF leakage or active bleeding.  Removed the [facility] retractor,  irrigated the tissues, and closed with 1 Vicryl interrupted figure-of-eight, subcu with 2-0, skin with staples.  Wound was dressed sterilely, placed supine on the hospital bed, extubated without difficulty, and transported to the recovery room in satisfactory condition.  The patient tolerated the procedure well.  No complications.  Assistant, Cleophas Dunker, [place].  Minimal blood loss.     Susa Day, M.D.     Geralynn Rile  D:   11/01/2013  T:  11/02/2013  Job:  [id number]

## 2013-11-02 NOTE — Evaluation (Signed)
Occupational Therapy Evaluation Patient Details Name: DAYANE HILLENBURG MRN: [id number] DOB: Aug 13, 1946 Today's Date: 11/02/2013 Time: 4081-4481 OT Time Calculation (min): 25 min  OT Assessment / Plan / Recommendation History of present illness MICRODISCECTOMY LUMBAR DECOMPRESSION L4-L5 due to HNP and stenosis   Clinical Impression   All education completed for d/c home with family today.    OT Assessment  Patient does not need any further OT services    Follow Up Recommendations  No OT follow up;Supervision/Assistance - 24 hour    Barriers to Discharge      Equipment Recommendations  3 in 1 bedside comode    Recommendations for Other Services    Frequency       Precautions / Restrictions Precautions Precautions: Back Precaution Comments: reviewed all back precautions with pt and daughter and reviewed handout   Pertinent Vitals/Pain 3/10 back ; reposition, had pain meds    ADL  Eating/Feeding: Independent Where Assessed - Eating/Feeding: Chair Grooming: Wash/dry hands;Min guard Where Assessed - Grooming: Unsupported standing Upper Body Bathing: Chest;Right arm;Left arm;Abdomen;Supervision/safety;Set up Where Assessed - Upper Body Bathing: Unsupported sitting Lower Body Bathing: Simulated;Min guard Where Assessed - Lower Body Bathing: Supported sit to stand Upper Body Dressing: Performed;Set up;Supervision/safety Where Assessed - Upper Body Dressing: Unsupported sitting Lower Body Dressing: Performed;Min guard Where Assessed - Lower Body Dressing: Supported sit to stand Toilet Transfer: Performed;Min guard Science writer: Raised toilet seat with arms (or 3-in-1 over toilet) Toileting - Clothing Manipulation and Hygiene: Performed;Min guard Where Assessed - Best boy and Hygiene: Sit to stand from 3-in-1 or toilet Tub/Shower Transfer: Simulated;Min guard;Minimal assistance Equipment Used: Rolling walker ADL Comments: reviewed back  care handout and all back precautions with pt and family. Pt dressed with supervision/min guard with cues for precautions. Recommend 3in1 and discussed use as shower chair and having supervision with shower initially.     OT Diagnosis:    OT Problem List:   OT Treatment Interventions:     OT Goals(Current goals can be found in the care plan section) Acute Rehab OT Goals Patient Stated Goal: home  Visit Information  Last OT Received On: 11/02/13 Assistance Needed: +1 History of Present Illness: MICRODISCECTOMY LUMBAR DECOMPRESSION L4-L5 due to HNP and stenosis       Prior Functioning     Home Living Family/patient expects to be discharged to:: Private residence Living Arrangements: Alone Available Help at Discharge: Family;Available PRN/intermittently;Other (Comment) (states daughter lives nearby and always available) Home Access: Stairs to enter Technical brewer of Steps: 2-3 Entrance Stairs-Rails: Right Home Layout: One level Home Equipment: Cane - single point Additional Comments: per daughter, pt is coming to her house initially. bed room upstairs with full bath. Prior Function Level of Independence: Independent Communication Communication: No difficulties         Vision/Perception     Cognition  Cognition Arousal/Alertness: Awake/alert Behavior During Therapy: WFL for tasks assessed/performed Overall Cognitive Status: Within Functional Limits for tasks assessed    Extremity/Trunk Assessment Upper Extremity Assessment Upper Extremity Assessment: Overall WFL for tasks assessed Lower Extremity Assessment Lower Extremity Assessment: Overall WFL for tasks assessed (denies numbness and tingling)     Mobility  Transfers Overall transfer level: Needs assistance Equipment used: Rolling walker (2 wheeled) Transfers: Sit to/from Stand Sit to Stand: Min guard General transfer comment: verbal cues back precautions     Exercise     Balance     End of  Session OT - End of Session Equipment Utilized During Treatment: Rolling  walker Activity Tolerance: Patient tolerated treatment well Patient left: in chair;with call bell/phone within reach;with family/visitor present  GO Functional Assessment Tool Used: clinical judgement Functional Limitation: Self care Self Care Current Status (K5993): At least 1 percent but less than 20 percent impaired, limited or restricted Self Care Goal Status (T7017): At least 1 percent but less than 20 percent impaired, limited or restricted Self Care Discharge Status [phone]): At least 1 percent but less than 20 percent impaired, limited or restricted   Jules Schick [phone] 11/02/2013, 1:11 PM

## 2013-11-02 NOTE — Care Management Note (Signed)
    Page 1 of 2   11/02/2013     4:57:44 PM   CARE MANAGEMENT NOTE 11/02/2013  Patient:  TAYNA, SMETHURST   Account Number:  [id number]  Date Initiated:  11/01/2013  Documentation initiated by:  DAVIS,TYMEEKA  Subjective/Objective Assessment:   68 yo female admitted with lumbar stenosis. S/P MICRODISCECTOMY LUMBAR DECOMPRESSION L4-L5   ( 1 LEVEL     Action/Plan:   Home when stable   Anticipated DC Date:  11/02/2013   Anticipated DC Plan:  [place]  CM consult      PAC Choice  DURABLE MEDICAL EQUIPMENT   Choice offered to / List presented to:  NA   DME arranged  3-N-1  Vassie Moselle      DME agency  [place] arranged  NA      [place] agency  NA   Status of service:  Completed, signed off Medicare Important Message given?   (If response is "NO", the following Medicare IM given date fields will be blank) Date Medicare IM given:   Date Additional Medicare IM given:    Discharge Disposition:  [place]  Per UR Regulation:  Reviewed for med. necessity/level of care/duration of stay  If discussed at [place] of Stay Meetings, dates discussed:    Comments:  11/02/2013 [place] [phone] CM spoke with patient & daughter. Plans are for patient to return to home in [facility] where daughter will be her caregiver. States she will need RW and 3n1. There are no HH needs or orders. [facility] notified of pt's DME.   11/01/13 [place] [phone] UR complete

## 2013-11-02 NOTE — Discharge Summary (Signed)
Physician Discharge Summary   Patient ID: Chloe Snyder MRN: [id number] DOB/AGE: 1946-03-16 68 y.o.  Admit date: 11/01/2013 Discharge date: 11/02/2013  Primary Diagnosis:   HNP AND STENOSIS   Admission Diagnoses:  Past Medical History  Diagnosis Date  . DJD (degenerative joint disease)   . Insomnia   . Hyperlipidemia   . Heart murmur     per MD note - pt not aware of any murmur  . Dysrhythmia     Hx Afib per MD note - pt unaware of any hx of a fib  . History of skin cancer    Discharge Diagnoses:   Principal Problem:   Spinal stenosis, lumbar region, with neurogenic claudication Active Problems:   HNP (herniated nucleus pulposus), lumbar  Procedure:  Procedure(s) (LRB):  MICRODISCECTOMY LUMBAR DECOMPRESSION L4-L5   ( 1 LEVEL) (N/A)   Consults: None  HPI:  see H&P    Laboratory Data: Hospital Outpatient Visit on 10/27/2013  Component Date Value Range Status  . Sodium 10/27/2013 140  137 - 147 mEq/L Final   Please note change in reference range.  . Potassium 10/27/2013 4.3  3.7 - 5.3 mEq/L Final   Please note change in reference range.  . Chloride 10/27/2013 102  96 - 112 mEq/L Final  . CO2 10/27/2013 26  19 - 32 mEq/L Final  . Glucose, Bld 10/27/2013 102* 70 - 99 mg/dL Final  . BUN 10/27/2013 8  6 - 23 mg/dL Final  . Creatinine, Ser 10/27/2013 0.86  0.50 - 1.10 mg/dL Final  . Calcium 10/27/2013 9.4  8.4 - 10.5 mg/dL Final  . GFR calc non Af Amer 10/27/2013 69* >90 mL/min Final  . GFR calc Af Amer 10/27/2013 80* >90 mL/min Final   Comment: (NOTE)                          The eGFR has been calculated using the CKD EPI equation.                          This calculation has not been validated in all clinical situations.                          eGFR's persistently <90 mL/min signify possible Chronic Kidney                          Disease.  . WBC 10/27/2013 7.1  4.0 - 10.5 K/uL Final  . RBC 10/27/2013 4.46  3.87 - 5.11 MIL/uL Final  . Hemoglobin 10/27/2013  14.2  12.0 - 15.0 g/dL Final  . HCT 10/27/2013 41.5  36.0 - 46.0 % Final  . MCV 10/27/2013 93.0  78.0 - 100.0 fL Final  . MCH 10/27/2013 31.8  26.0 - 34.0 pg Final  . MCHC 10/27/2013 34.2  30.0 - 36.0 g/dL Final  . RDW 10/27/2013 12.1  11.5 - 15.5 % Final  . Platelets 10/27/2013 235  150 - 400 K/uL Final  . MRSA, PCR 10/27/2013 NEGATIVE  NEGATIVE Final  . Staphylococcus aureus 10/27/2013 NEGATIVE  NEGATIVE Final   Comment:                                 The Xpert SA Assay (FDA  approved for NASAL specimens                          in patients over 24 years of age),                          is one component of                          a comprehensive surveillance                          program.  Test performance has                          been validated by [employer] for patients greater                          than or equal to 78 year old.                          It is not intended                          to diagnose infection nor to                          guide or monitor treatment.   No results found for this basename: HGB,  in the last 72 hours No results found for this basename: WBC, RBC, HCT, PLT,  in the last 72 hours  Recent Labs  11/02/13 0525  NA 137  K 3.9  CL 101  CO2 27  BUN 6  CREATININE 0.76  GLUCOSE 122*  CALCIUM 8.8   No results found for this basename: LABPT, INR,  in the last 72 hours  X-Rays:Dg Lumbar Spine 2-3 Views  10/27/2013   CLINICAL DATA:  Preoperative examination (lumbar decompression; herniated disc at L4-L5)  EXAM: LUMBAR SPINE - 2-3 VIEW  COMPARISON:  None.  FINDINGS: There are 5 non rib-bearing lumbar type vertebral bodies.  There is straightening of the expected lumbar lordosis. No definite anterolisthesis or retrolisthesis.  There is mild focal concavity involving the superior endplate of the L1 vertebral body without vertebral body height loss. Remaining vertebral body heights are  preserved.  There is moderate multilevel lumbar spine DDD, worse at L3-L4 and L4-L5 with disc space height loss, endplate irregularity and sclerosis.  Limited visualization the bilateral SI joints is normal.  Regional bowel gas pattern is normal. Atherosclerotic plaque within the abdominal aorta and pelvic vasculature.  IMPRESSION: Moderate multilevel lumbar spine DDD, worse at L3-L4 and L4-L5.   Electronically Signed   By: Sandi Mariscal M.D.   On: 10/27/2013 15:07   Dg Spine Portable 1 View  11/01/2013   CLINICAL DATA:  L4-5 decompression.  EXAM: PORTABLE SPINE - 1 VIEW  COMPARISON:  Intraoperative views of the lumbar spine earlier this same day.  FINDINGS: Lateral view of the lumbar spine demonstrates a probe at the L4-5 level.  IMPRESSION: Localization is above.   Electronically Signed   By:  Inge Rise M.D.   On: 11/01/2013 13:02   Dg Spine Portable 1 View  11/01/2013   CLINICAL DATA:  L4-5 decompression.  EXAM: PORTABLE SPINE - 1 VIEW  COMPARISON:  Intraoperative view of the lumbar spine 11/01/2013 at 11:35 a.m.  FINDINGS: Metallic probe is in place at the level of the L4-5 foramina.  IMPRESSION: As above.   Electronically Signed   By: Inge Rise M.D.   On: 11/01/2013 12:19   Dg Spine Portable 1 View  11/01/2013   CLINICAL DATA:  L4-5 decompression.  EXAM: PORTABLE SPINE - 1 VIEW  COMPARISON:  Plain films lumbar spine 10/27/2013.  FINDINGS: Two metallic probes are placed. The more superior probe destructive toward the L4-5 interspace and the more inferior probe is at the level of the L5 spinous process and L5 pedicles.  IMPRESSION: Localization as above.   Electronically Signed   By: Inge Rise M.D.   On: 11/01/2013 12:02    EKG:No orders found for this or any previous visit.   Hospital Course: Patient was admitted to [facility] and taken to the OR and underwent the above state procedure without complications.  Patient tolerated the procedure well and was later transferred to  the recovery room and then to the orthopaedic floor for postoperative care.  They were given PO and IV analgesics for pain control following their surgery.  They were given 24 hours of postoperative antibiotics.   PT was consulted postop to assist with mobility and transfers.  The patient was allowed to be WBAT with therapy and was taught back precautions. Discharge planning was consulted to help with postop disposition and equipment needs.  Patient had a good night on the evening of surgery and started to get up OOB with therapy on day one. Patient was seen in rounds and was ready to go home on day one.  They were given discharge instructions and dressing directions.  They were instructed on when to follow up in the office with Dr. Tonita Cong.  Discharge Medications: Prior to Admission medications   Medication Sig Start Date End Date Taking? Authorizing Provider  rosuvastatin (CRESTOR) 10 MG tablet Take 10 mg by mouth daily with breakfast.   Yes Historical Provider, MD  traZODone (DESYREL) 100 MG tablet Take 100 mg by mouth at bedtime.   Yes Historical Provider, MD  zolpidem (AMBIEN) 10 MG tablet Take 10 mg by mouth at bedtime.   Yes Historical Provider, MD  HYDROcodone-acetaminophen (NORCO) 7.5-325 MG per tablet Take 1-2 tablets by mouth every 4 (four) hours as needed for moderate pain. 11/01/13   Johnn Hai, MD    Diet: Regular diet Activity:WBAT; Lspine precautions Follow-up:in 10-14 days Disposition - Home Discharged Condition: good  Smoking cessation  Discharge Orders   Future Orders Complete By Expires   Call MD / Call 911  As directed    Comments:     If you experience chest pain or shortness of breath, CALL 911 and be transported to the hospital emergency room.  If you develope a fever above 101 F, pus (white drainage) or increased drainage or redness at the wound, or calf pain, call your surgeon's office.   Constipation Prevention  As directed    Comments:     Drink plenty of  fluids.  Prune juice may be helpful.  You may use a stool softener, such as Colace (over the counter) 100 mg twice a day.  Use MiraLax (over the counter) for constipation as needed.  Diet - low sodium heart healthy  As directed    Increase activity slowly as tolerated  As directed        Medication List         HYDROcodone-acetaminophen 7.5-325 MG per tablet  Commonly known as:  NORCO  Take 1-2 tablets by mouth every 4 (four) hours as needed for moderate pain.     rosuvastatin 10 MG tablet  Commonly known as:  CRESTOR  Take 10 mg by mouth daily with breakfast.     traZODone 100 MG tablet  Commonly known as:  DESYREL  Take 100 mg by mouth at bedtime.     zolpidem 10 MG tablet  Commonly known as:  AMBIEN  Take 10 mg by mouth at bedtime.           Follow-up Information   Follow up with BEANE,JEFFREY C, MD In 2 weeks.   Specialty:  Orthopedic Surgery   Contact information:   [address] [place] [address] [phone]       Signed: Cecilie Kicks. 11/02/2013, 2:40 PM

## 2013-11-02 NOTE — Evaluation (Signed)
Physical Therapy Evaluation Patient Details Name: Chloe Snyder MRN: [id number] DOB: 12/09/1945 Today's Date: 11/02/2013 Time: 0912-0929 PT Time Calculation (min): 17 min  PT Assessment / Plan / Recommendation History of Present Illness  MICRODISCECTOMY LUMBAR DECOMPRESSION L4-L5 due to HNP and stenosis  Clinical Impression  Patient is s/p lumbar decompression surgery resulting in functional limitations due to the deficits listed below (see PT Problem List).  Patient will benefit from skilled PT to increase their independence and safety with mobility to allow discharge to the venue listed below.  Pt ambulated short distance in hallway and performed stairs.  Pt with limited mobility due to pain however no physical assist required just min/guard for safety and cues.     PT Assessment  Patient needs continued PT services    Follow Up Recommendations  No PT follow up    Does the patient have the potential to tolerate intense rehabilitation      Barriers to Discharge        Equipment Recommendations  Rolling walker with 5" wheels    Recommendations for Other Services     Frequency Min 5X/week    Precautions / Restrictions Precautions Precautions: Back Precaution Comments: educated on back precautions and provided handout   Pertinent Vitals/Pain 5/10 low back pain, increased during ambulation, premedicated then received 2nd pain pill after session, repositioned sidelying with pillow between knees      Mobility  Bed Mobility Overal bed mobility: Needs Assistance Bed Mobility: Sidelying to Sit Sidelying to sit: Min guard General bed mobility comments: educated on log roll technique which pt states she usually performs bed mobility this way, hand provided to pull trunk upright Transfers Overall transfer level: Needs assistance Transfers: Sit to/from Stand Sit to Stand: Min guard General transfer comment: verbal cues for straight back Ambulation/Gait Ambulation/Gait  assistance: Min guard Ambulation Distance (Feet): 80 Feet Assistive device: Rolling walker (2 wheeled) Gait Pattern/deviations: Step-through pattern;Trunk flexed Gait velocity: constant verbal cues for posture as pt tends to ambulate in trunk flexion (states habit prior to surgery), increased pain with ambulation limiting distance Stairs: Yes Stairs assistance: Min guard Stair Management: Step to pattern;One rail Right;Two rails;Forwards Number of Stairs: 2 General stair comments: verbal cues for safety, sequence, technique, pt reports one rail at home however utilized both rails at times, states daughter will be available to assist with steps home    Exercises     PT Diagnosis: Difficulty walking;Acute pain  PT Problem List: Decreased mobility;Pain;Decreased safety awareness;Decreased knowledge of precautions;Decreased knowledge of use of DME PT Treatment Interventions: DME instruction;Gait training;Stair training;Functional mobility training;Patient/family education;Therapeutic activities;Therapeutic exercise     PT Goals(Current goals can be found in the care plan section) Acute Rehab PT Goals PT Goal Formulation: With patient Time For Goal Achievement: 11/04/13 Potential to Achieve Goals: Good  Visit Information  Last PT Received On: 11/02/13 Assistance Needed: +1 History of Present Illness: MICRODISCECTOMY LUMBAR DECOMPRESSION L4-L5 due to HNP and stenosis       Prior Functioning  Home Living Family/patient expects to be discharged to:: Private residence Living Arrangements: Alone Available Help at Discharge: Family;Available PRN/intermittently;Other (Comment) (states daughter lives nearby and always available) Home Access: Stairs to enter Technical brewer of Steps: 2-3 Entrance Stairs-Rails: Right Home Layout: One level Home Equipment: Cane - single point Prior Function Level of Independence: Independent Communication Communication: No difficulties     Cognition  Cognition Arousal/Alertness: Awake/alert Behavior During Therapy: WFL for tasks assessed/performed Overall Cognitive Status: Within Functional Limits for tasks assessed  Extremity/Trunk Assessment Lower Extremity Assessment Lower Extremity Assessment: Overall WFL for tasks assessed (denies numbness and tingling)   Balance    End of Session PT - End of Session Activity Tolerance: Patient limited by pain Patient left: in bed;with call bell/phone within reach  GP Functional Assessment Tool Used: clinical judgement Functional Limitation: Mobility: Walking and moving around Mobility: Walking and Moving Around Current Status ([id number]): At least 1 percent but less than 20 percent impaired, limited or restricted Mobility: Walking and Moving Around Goal Status [phone]): 0 percent impaired, limited or restricted   Kelden Lavallee,KATHrine E 11/02/2013, 9:46 AM Carmelia Bake, PT, DPT 11/02/2013 Pager: [phone]

## 2014-07-25 ENCOUNTER — Emergency Department: Payer: Self-pay | Admitting: Emergency Medicine

## 2014-07-25 LAB — URINALYSIS, COMPLETE
BLOOD: NEGATIVE
Bacteria: NONE SEEN
GLUCOSE, UR: NEGATIVE mg/dL (ref 0–75)
LEUKOCYTE ESTERASE: NEGATIVE
Nitrite: NEGATIVE
Ph: 5 (ref 4.5–8.0)
Protein: 30
RBC, UR: NONE SEEN /HPF (ref 0–5)
Specific Gravity: 1.026 (ref 1.003–1.030)
Squamous Epithelial: 2
WBC UR: 5 /HPF (ref 0–5)

## 2014-07-25 LAB — COMPREHENSIVE METABOLIC PANEL
ALBUMIN: 3.4 g/dL (ref 3.4–5.0)
Alkaline Phosphatase: 85 U/L
Anion Gap: 7 (ref 7–16)
BUN: 11 mg/dL (ref 7–18)
Bilirubin,Total: 0.5 mg/dL (ref 0.2–1.0)
Calcium, Total: 9.3 mg/dL (ref 8.5–10.1)
Chloride: 100 mmol/L (ref 98–107)
Co2: 30 mmol/L (ref 21–32)
Creatinine: 0.74 mg/dL (ref 0.60–1.30)
GLUCOSE: 103 mg/dL — AB (ref 65–99)
Osmolality: 273 (ref 275–301)
POTASSIUM: 3.7 mmol/L (ref 3.5–5.1)
SGOT(AST): 43 U/L — ABNORMAL HIGH (ref 15–37)
SGPT (ALT): 15 U/L
SODIUM: 137 mmol/L (ref 136–145)
Total Protein: 8.6 g/dL — ABNORMAL HIGH (ref 6.4–8.2)

## 2014-07-25 LAB — CBC
HCT: 41.2 % (ref 35.0–47.0)
HGB: 12.9 g/dL (ref 12.0–16.0)
MCH: 28.9 pg (ref 26.0–34.0)
MCHC: 31.3 g/dL — AB (ref 32.0–36.0)
MCV: 92 fL (ref 80–100)
Platelet: 380 10*3/uL (ref 150–440)
RBC: 4.46 10*6/uL (ref 3.80–5.20)
RDW: 13.1 % (ref 11.5–14.5)
WBC: 6.4 10*3/uL (ref 3.6–11.0)

## 2014-07-25 LAB — CK TOTAL AND CKMB (NOT AT ARMC)
CK, Total: 49 U/L
CK-MB: 0.5 ng/mL (ref 0.5–3.6)

## 2014-07-25 LAB — TROPONIN I

## 2014-08-01 ENCOUNTER — Encounter (HOSPITAL_COMMUNITY): Payer: Self-pay | Admitting: *Deleted

## 2014-08-01 ENCOUNTER — Inpatient Hospital Stay (HOSPITAL_COMMUNITY)
Admission: AD | Admit: 2014-08-01 | Discharge: 2014-08-03 | DRG: 180 | Disposition: A | Discharge status: Home Health | Payer: Medicare HMO | Source: Other Acute Inpatient Hospital | Attending: Internal Medicine | Admitting: Internal Medicine

## 2014-08-01 DIAGNOSIS — F17200 Nicotine dependence, unspecified, uncomplicated: Secondary | ICD-10-CM | POA: Diagnosis present

## 2014-08-01 DIAGNOSIS — J90 Pleural effusion, not elsewhere classified: Secondary | ICD-10-CM | POA: Diagnosis present

## 2014-08-01 DIAGNOSIS — Z85828 Personal history of other malignant neoplasm of skin: Secondary | ICD-10-CM

## 2014-08-01 DIAGNOSIS — R590 Localized enlarged lymph nodes: Secondary | ICD-10-CM | POA: Diagnosis present

## 2014-08-01 DIAGNOSIS — M1990 Unspecified osteoarthritis, unspecified site: Secondary | ICD-10-CM | POA: Diagnosis present

## 2014-08-01 DIAGNOSIS — Z720 Tobacco use: Secondary | ICD-10-CM | POA: Diagnosis not present

## 2014-08-01 DIAGNOSIS — M4806 Spinal stenosis, lumbar region: Secondary | ICD-10-CM | POA: Diagnosis present

## 2014-08-01 DIAGNOSIS — F322 Major depressive disorder, single episode, severe without psychotic features: Secondary | ICD-10-CM

## 2014-08-01 DIAGNOSIS — F329 Major depressive disorder, single episode, unspecified: Secondary | ICD-10-CM | POA: Diagnosis present

## 2014-08-01 DIAGNOSIS — Z681 Body mass index (BMI) 19 or less, adult: Secondary | ICD-10-CM

## 2014-08-01 DIAGNOSIS — J948 Other specified pleural conditions: Secondary | ICD-10-CM

## 2014-08-01 DIAGNOSIS — R634 Abnormal weight loss: Secondary | ICD-10-CM | POA: Diagnosis present

## 2014-08-01 DIAGNOSIS — R011 Cardiac murmur, unspecified: Secondary | ICD-10-CM | POA: Diagnosis present

## 2014-08-01 DIAGNOSIS — I4891 Unspecified atrial fibrillation: Secondary | ICD-10-CM | POA: Diagnosis present

## 2014-08-01 DIAGNOSIS — E43 Unspecified severe protein-calorie malnutrition: Secondary | ICD-10-CM | POA: Insufficient documentation

## 2014-08-01 DIAGNOSIS — Z79899 Other long term (current) drug therapy: Secondary | ICD-10-CM

## 2014-08-01 DIAGNOSIS — E785 Hyperlipidemia, unspecified: Secondary | ICD-10-CM | POA: Diagnosis present

## 2014-08-01 DIAGNOSIS — G4700 Insomnia, unspecified: Secondary | ICD-10-CM | POA: Diagnosis present

## 2014-08-01 DIAGNOSIS — C799 Secondary malignant neoplasm of unspecified site: Secondary | ICD-10-CM

## 2014-08-01 DIAGNOSIS — C3491 Malignant neoplasm of unspecified part of right bronchus or lung: Principal | ICD-10-CM | POA: Diagnosis present

## 2014-08-01 DIAGNOSIS — M48062 Spinal stenosis, lumbar region with neurogenic claudication: Secondary | ICD-10-CM | POA: Diagnosis present

## 2014-08-01 DIAGNOSIS — Z9889 Other specified postprocedural states: Secondary | ICD-10-CM | POA: Diagnosis not present

## 2014-08-01 LAB — COMPREHENSIVE METABOLIC PANEL
ALT: 6 U/L (ref 0–35)
AST: 22 U/L (ref 0–37)
Albumin: 3 g/dL — ABNORMAL LOW (ref 3.5–5.2)
Alkaline Phosphatase: 78 U/L (ref 39–117)
Anion gap: 14 (ref 5–15)
BILIRUBIN TOTAL: 0.4 mg/dL (ref 0.3–1.2)
BUN: 13 mg/dL (ref 6–23)
CALCIUM: 9.6 mg/dL (ref 8.4–10.5)
CO2: 27 meq/L (ref 19–32)
CREATININE: 0.63 mg/dL (ref 0.50–1.10)
Chloride: 97 mEq/L (ref 96–112)
GFR calc Af Amer: >90 mL/min (ref >90)
GFR calc non Af Amer: >90 mL/min (ref >90)
Glucose, Bld: 106 mg/dL — ABNORMAL HIGH (ref 70–99)
Potassium: 5.2 mEq/L (ref 3.7–5.3)
Sodium: 138 mEq/L (ref 137–147)
Total Protein: 7.6 g/dL (ref 6.0–8.3)

## 2014-08-01 LAB — CBC WITH DIFFERENTIAL/PLATELET
Basophils Absolute: 0 10*3/uL (ref 0.0–0.1)
Basophils Relative: 0 % (ref 0–1)
EOS PCT: 1 % (ref 0–5)
Eosinophils Absolute: 0.1 10*3/uL (ref 0.0–0.7)
HCT: 34.8 % — ABNORMAL LOW (ref 36.0–46.0)
Hemoglobin: 11.3 g/dL — ABNORMAL LOW (ref 12.0–15.0)
LYMPHS ABS: 1.3 10*3/uL (ref 0.7–4.0)
LYMPHS PCT: 24 % (ref 12–46)
MCH: 29.1 pg (ref 26.0–34.0)
MCHC: 32.5 g/dL (ref 30.0–36.0)
MCV: 89.7 fL (ref 78.0–100.0)
Monocytes Absolute: 0.5 10*3/uL (ref 0.1–1.0)
Monocytes Relative: 10 % (ref 3–12)
NEUTROS ABS: 3.5 10*3/uL (ref 1.7–7.7)
Neutrophils Relative %: 65 % (ref 43–77)
PLATELETS: 350 10*3/uL (ref 150–400)
RBC: 3.88 MIL/uL (ref 3.87–5.11)
RDW: 12.7 % (ref 11.5–15.5)
WBC: 5.5 10*3/uL (ref 4.0–10.5)

## 2014-08-01 LAB — LACTATE DEHYDROGENASE: LDH: 291 U/L — AB (ref 94–250)

## 2014-08-01 LAB — PROTIME-INR
INR: 1.07 (ref 0.00–1.49)
PROTHROMBIN TIME: 14 s (ref 11.6–15.2)

## 2014-08-01 LAB — TSH: TSH: 0.633 u[IU]/mL (ref 0.350–4.500)

## 2014-08-01 MED ORDER — HYDROCODONE-ACETAMINOPHEN 5-325 MG PO TABS
1.0000 | ORAL_TABLET | ORAL | Status: DC | PRN
  Administered 2014-08-02: 2 via ORAL
  Filled 2014-08-01: qty 2

## 2014-08-01 MED ORDER — MORPHINE SULFATE 2 MG/ML IJ SOLN
2.0000 mg | Freq: Four times a day (QID) | INTRAMUSCULAR | Status: DC
Start: 2014-08-01 — End: 2014-08-03
  Administered 2014-08-01: 2 mg via INTRAVENOUS
  Filled 2014-08-01 (×3): qty 1

## 2014-08-01 MED ORDER — ONDANSETRON HCL 4 MG/2ML IJ SOLN: 4.0000 mg | Freq: Four times a day (QID) | INTRAMUSCULAR | Status: DC | PRN

## 2014-08-01 MED ORDER — NICOTINE 14 MG/24HR TD PT24
14.0000 mg | MEDICATED_PATCH | Freq: Every day | TRANSDERMAL | Status: DC
  Filled 2014-08-01 (×3): qty 1

## 2014-08-01 MED ORDER — ENOXAPARIN SODIUM 40 MG/0.4ML ~~LOC~~ SOLN
40.0000 mg | SUBCUTANEOUS | Status: DC
  Administered 2014-08-01: 40 mg via SUBCUTANEOUS
  Filled 2014-08-01 (×2): qty 0.4

## 2014-08-01 MED ORDER — ONDANSETRON HCL 4 MG PO TABS: 4.0000 mg | ORAL_TABLET | Freq: Four times a day (QID) | ORAL | Status: DC | PRN

## 2014-08-01 MED ORDER — ACETAMINOPHEN 650 MG RE SUPP
650.0000 mg | Freq: Four times a day (QID) | RECTAL | Status: DC | PRN
Start: 2014-08-01 — End: 2014-08-03

## 2014-08-01 MED ORDER — ACETAMINOPHEN 325 MG PO TABS: 650.0000 mg | ORAL_TABLET | Freq: Four times a day (QID) | ORAL | Status: DC | PRN

## 2014-08-01 MED ORDER — GUAIFENESIN-DM 100-10 MG/5ML PO SYRP: 5.0000 mL | ORAL_SOLUTION | ORAL | Status: DC | PRN

## 2014-08-01 MED ORDER — SODIUM CHLORIDE 0.9 % IV SOLN
INTRAVENOUS | Status: DC
  Administered 2014-08-02: 08:00:00 via INTRAVENOUS

## 2014-08-01 MED ORDER — TRAZODONE HCL 100 MG PO TABS
100.0000 mg | ORAL_TABLET | Freq: Every day | ORAL | Status: DC
  Administered 2014-08-01 – 2014-08-02 (×2): 100 mg via ORAL
  Filled 2014-08-01 (×3): qty 1

## 2014-08-01 NOTE — H&P (Addendum)
[facility] History and Physical  NOEL HENANDEZ XKG:[id number] DOB: 09-26-1946 DOA: 08/01/2014  Referring physician: Dr Renard Hamper in [facility] PCP: Maryland Pink, MD   Chief Complaint:  [facility] from [facility] in [place] for hypoxia and abnormal CT findings   History mainly provided by patient's daughter [name] as patient has a flat affect and does not participate much in discussion HPI:  68 y/o female with depression, tobacco use, lumbar spinal stenosis with decompression surgery early this year was admitted to [facility] about 1 and half  week back. She was admitted there as daughter was very concerned about her health since patient was not getting out of bed, appeared depressed and had poor appetite for past few months. . The daughter was helping her with her groceries and cleaning but patient had been sitting in her bed or couch all day and not wanting to participate in anything. She took her to her PCP and found to have 54 lb weight loss more evident in last 3 months. She also has been coughing frequently. Her PCP referred her to be admitted to [place]. As per daughter pt had CXR done there and given her severe depression she was involuntarily committed and sent to [facility]. Her labs at [facility] were unremarkable ( CBC, BMET ), had low B12 of 250. Head CT was unremarkable. A CT of the chest without contrat was done given right pleural effusion seen on earlier CXR and showed which showed "extensive lobulated pleural   thickening and multiple pleural based masses in the right hemithorax consistent with pleural spread of malignancy. There was also a moderate size right pleural effusion and compressive atelectasis of a portion of the right lower lobe. Patient also had hilar and mediastinal adenopathy. She also had a lumbar CT scan which showed minimal disc bulge and facet arthrosis at L1-L2 and minimal lateral disc bulging and facet  arthrosis with at L2-L3 with disc bulging and slight lateral endplate arthrosis at F0-Y6."  Patient denies headache, dizziness, fever, chills, nausea , vomiting, chest pain, palpitations, SOB, abdominal pain, bowel or urinary symptoms. He reports generalized weakness and back pain. Reports poor appetite. He reports being depressed but denies any suicidal ideations. She usually answers with either  yes or a no and does not give detailed answers.  As for the physician at [facility] patient was hypoxic this morning and given her CT findings they do not have further resource or subspeciality to evaluate her pleural effusion further and as per her daughter's request to have patient transferred to [facility], hospitalist was consulted and patient accepted to [facility] hospital.  Review of Systems:  Constitutional: Denies fever, chills, diaphoresis, appetite change and fatigue.  HEENT: Denies visual symptoms, ear pain, congestion,  trouble swallowing, neck pain, neck stiffness Respiratory: Denies SOB, DOE, cough, chest tightness,  and wheezing.   Cardiovascular: Denies chest pain, palpitations and leg swelling.  Gastrointestinal: Denies nausea, vomiting, abdominal pain, diarrhea, constipation, blood in stool and abdominal distention.  Genitourinary: Denies dysuria,  hematuria,and difficulty urinating.  Endocrine: Denies: hot or cold intolerance,  polyuria, polydipsia. Musculoskeletal: Chronic low back pain, Denies myalgias,  joint swelling, arthralgias . Reports poor mobility due to back pain  Skin: Denies , rash and wound.  Neurological: Weakness, Denies dizziness, seizures, syncope,  light-headedness, numbness and headaches.  Psychiatric/Behavioral:  feels depressed,Denies suicidal ideation, , confusion,  sleep disturbance and agitation   Past Medical History  Diagnosis Date  . DJD (degenerative joint disease)   .  Insomnia   . Hyperlipidemia   . Heart murmur     per MD note - pt not aware of  any murmur  . Dysrhythmia     Hx Afib per MD note - pt unaware of any hx of a fib  . History of skin cancer    Past Surgical History  Procedure Laterality Date  . Appendectomy    . Tonsillectomy    . Lumbar laminectomy/decompression microdiscectomy N/A 11/01/2013    Procedure:  MICRODISCECTOMY LUMBAR DECOMPRESSION L4-L5   ( 1 LEVEL);  Surgeon: Johnn Hai, MD;  Location: [facility];  Service: Orthopedics;  Laterality: N/A;   Social History:  reports that she has been smoking.  She does not have any smokeless tobacco history on file. She reports that she does not drink alcohol or use illicit drugs.  No Known Allergies  No family history on file.  Prior to Admission medications   Medication Sig Start Date End Date Taking? Authorizing Provider  HYDROcodone-acetaminophen (NORCO) 7.5-325 MG per tablet Take 1-2 tablets by mouth every 4 (four) hours as needed for moderate pain. 11/01/13   Johnn Hai, MD  rosuvastatin (CRESTOR) 10 MG tablet Take 10 mg by mouth daily with breakfast.    Historical Provider, MD  traZODone (DESYREL) 100 MG tablet Take 100 mg by mouth at bedtime.    Historical Provider, MD  zolpidem (AMBIEN) 10 MG tablet Take 10 mg by mouth at bedtime.    Historical Provider, MD     Physical Exam:  Filed Vitals:   08/01/14 1627  BP: 98/54  Pulse: 71  Temp: 98.3 F (36.8 C)  TempSrc: Oral  Resp: 18  SpO2: 100%    Constitutional: Vital signs reviewed.  Patient is a thin built female in no acute distress. HEENT: no pallor, no icterus, moist oral mucosa, no cervical lymphadenopathy Cardiovascular: RRR, S1 normal, S2 normal, no MRG Chest: markedly diminished breath sounds over right lateral lung and the base.  no wheezes, rales, or rhonchi Abdominal: Soft. Non-tender, non-distended, bowel sounds are normal, Ext: warm, no edema Neurological: A&O x3, non focal  Labs on Admission:  Pending   EKG:   Assessment/Plan  Principal Problem:   Pleural effusion on  right Admit to med surg. Patient maintaining 100 % o2 sat on RA. Has limited right sided air entry on exam. Check CT chest with contrast. Depending upon size and extent of effsuion will obtain thoracentesis. Given hx of smoking and severe weight loss with CT findings as per outside hospital report , underlying malignancy is highly likely. -O2 via nasal cannula when necessary  check cbc, CMET and LDH. Pleural fluid analysis if planned for thoracentesis following CT scan results. -oncology consult and further imaging following CT chest results. -I would make her n.p.o. after midnight if thoracentesis scheduled in a.m.  Active Problems:   Spinal stenosis, lumbar region, with neurogenic claudication Report low back pain and was receiving fentanyl patch at outside hospital . Ill place on prn percocet and iv morphine. OT eval    DJD (degenerative joint disease) Had lumbar decompressive sx in january this year but did not participate in PT thereafter. PT eval and pain control    Major depression On cymbalta and trazodone as outpt. denies suicidal ideations. psych consult as needed     Tobacco use disorder Ordered nicotine patch  Severe protein calorie malnutrition  nutrition consult in am       Diet: regular  DVT prophylaxis: sq lovenox   Code  Status: full code Family Communication: discussed with son and daughter at bedside Disposition Plan: currently inpatient  Louellen Molder [facility] Pager [phone]  Total time spent on admission :70 minutes  If 7PM-7AM, please contact night-coverage [email or web address] Password [email or web address] 08/01/2014, 5:00 PM

## 2014-08-02 ENCOUNTER — Inpatient Hospital Stay (HOSPITAL_COMMUNITY): Payer: Medicare HMO

## 2014-08-02 ENCOUNTER — Encounter (HOSPITAL_COMMUNITY): Payer: Self-pay | Admitting: Radiology

## 2014-08-02 DIAGNOSIS — J948 Other specified pleural conditions: Secondary | ICD-10-CM

## 2014-08-02 DIAGNOSIS — E43 Unspecified severe protein-calorie malnutrition: Secondary | ICD-10-CM | POA: Insufficient documentation

## 2014-08-02 MED ORDER — IOHEXOL 300 MG/ML  SOLN
80.0000 mL | Freq: Once | INTRAMUSCULAR | Status: AC | PRN
  Administered 2014-08-02: 80 mL via INTRAVENOUS

## 2014-08-02 MED ORDER — FENTANYL CITRATE 0.05 MG/ML IJ SOLN
INTRAMUSCULAR | Status: AC
  Filled 2014-08-02: qty 6

## 2014-08-02 MED ORDER — MIDAZOLAM HCL 2 MG/2ML IJ SOLN
INTRAMUSCULAR | Status: AC
  Filled 2014-08-02: qty 6

## 2014-08-02 NOTE — Progress Notes (Signed)
Left hand NSL found covered with glove.  Glove removed.  Attempted flush with NS and was unable.  Removed dressing to find angiocath nearly completely out of the vein and kinked it two places. Angio cath d/c'ed.  Attempted PIV restart X3 attempts without success.  Will report to nurse caring for Chloe Snyder and recommend Vascular Access Team.

## 2014-08-02 NOTE — Consult Note (Signed)
Reason for consult: right pleural mass biopsy, possible thoracentesis  Referring Physician(s): TR[facility]  History of Present Illness: Chloe Snyder is a 68 y.o. female smoker with history of diminished appetite, weight loss, depression, cough, weakness/fatigue and recent imaging studies revealing extensive right pleural tumor with effusion and right pericardial and precarinal mediastinal adenopathy. 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 Request is now received for CT guided biopsy of the right pleural tumor and possible thoracentesis.   Past Medical History  Diagnosis Date  . DJD (degenerative joint disease)   . Insomnia   . Hyperlipidemia   . Heart murmur     per MD note - pt not aware of any murmur  . Dysrhythmia     Hx Afib per MD note - pt unaware of any hx of a fib  . History of skin cancer     Past Surgical History  Procedure Laterality Date  . Appendectomy    . Tonsillectomy    . Lumbar laminectomy/decompression microdiscectomy N/A 11/01/2013    Procedure:  MICRODISCECTOMY LUMBAR DECOMPRESSION L4-L5   ( 1 LEVEL);  Surgeon: Johnn Hai, MD;  Location: [facility];  Service: Orthopedics;  Laterality: N/A;    Allergies: Review of patient's allergies indicates no known allergies.  Medications: Prior to Admission medications   Medication Sig Start Date End Date Taking? Authorizing Provider  DULoxetine (CYMBALTA) 30 MG capsule Take 30 mg by mouth at bedtime.   Yes Historical Provider, MD  fentaNYL (DURAGESIC - DOSED MCG/HR) 25 MCG/HR patch Place 25 mcg onto the skin every 3 (three) days.   Yes Historical Provider, MD  traZODone (DESYREL) 100 MG tablet Take 100 mg by mouth at bedtime.   Yes Historical Provider, MD    History reviewed. No pertinent family history.  History   Social History  . Marital Status: Divorced    Spouse Name: N/A    Number of Children: N/A  . Years of Education: N/A   Social History Main Topics  . Smoking status: Current  Every Day Smoker -- 0.50 packs/day for 50 years  . Smokeless tobacco: Never Used  . Alcohol Use: No  . Drug Use: No  . Sexual Activity: None   Other Topics Concern  . None   Social History Narrative  . None         Review of Systems see above  Vital Signs: BP 119/47  Pulse 72  Temp(Src) 98.3 F (36.8 C) (Oral)  Resp 18  Ht 5\' 5"  (1.651 m)  Wt 111 lb (50.349 kg)  BMI 18.47 kg/m2  SpO2 93%  Physical Exam pt awake/alert, flat affect; cachetic appearing; family in room; chest- dim BS rt mid to lower lung field, left clear; heart- RRR; abd- soft,+BS,NT; ext- FROM, no edema.  Imaging: Ct Chest W Contrast  08/02/2014   CLINICAL DATA:  Pleural effusion on right, Dec appetite x few months; 54# wt loss x 3 mo; cough; sob; hypoxic 08/01/14; eval pl effusion/ adenopathy seen on CT @ [facility]; eval for underlying malignancy; LBP X 2 yr  EXAM: CT CHEST WITH CONTRAST  TECHNIQUE: Multidetector CT imaging of the chest was performed during intravenous contrast administration.  CONTRAST:  20mL OMNIPAQUE IOHEXOL 300 MG/ML  SOLN  COMPARISON:  Radiograph 07/25/2014 and earlier studies  FINDINGS: Moderate right pleural effusion with extensive nodular pleural thickening and confluent masses measuring up 3.5 cm.  Mediastinal adenopathy, precarinal node measured up to 2 cm short axis diameter. Confluent right  pericardial lymph node mass up to 2.3 cm short axis diameter.  No pericardial or left pleural effusion. No pleural plaques on the left.  Two small nodules in the right middle lobe measuring approximately 3 mm, images 27-28 sequence 5. There is extensive atelectasis throughout much of the right lower lobe limiting parenchymal evaluation. Emphysematous changes with subpleural blebs noted in both lung apices.  Mild plaque in the aortic arch and descending aorta.  Mild compression fracture deformities of T10 and superior endplate of L1, present since 10/27/2013.  IMPRESSION: 1. Extensive right pleural tumor  with effusion. Consideration includes mesothelioma versus extensive pleural metastatic disease. 2. Right pericardial and precarinal mediastinal adenopathy.   Electronically Signed   By: Arne Cleveland M.D.   On: 08/02/2014 09:38    Labs:  CBC:  Recent Labs  10/27/13 1440 08/01/14 1804  WBC 7.1 5.5  HGB 14.2 11.3*  HCT 41.5 34.8*  PLT 235 350    COAGS:  Recent Labs  08/01/14 1804  INR 1.07    BMP:  Recent Labs  10/27/13 1440 11/02/13 0525 08/01/14 1804  NA 140 137 138  K 4.3 3.9 5.2  CL 102 101 97  CO2 26 27 27   GLUCOSE 102* 122* 106*  BUN 8 6 13   CALCIUM 9.4 8.8 9.6  CREATININE 0.86 0.76 0.63  GFRNONAA 69* 86* >90  GFRAA 80* >90 >90    LIVER FUNCTION TESTS:  Recent Labs  08/01/14 1804  BILITOT 0.4  AST 22  ALT 6  ALKPHOS 78  PROT 7.6  ALBUMIN 3.0*    TUMOR MARKERS: No results found for this basename: AFPTM, CEA, CA199, CHROMGRNA,  in the last 8760 hours  Assessment and Plan: Chloe Snyder is a 68 y.o. female smoker with history of diminished appetite, weight loss, depression, cough, weakness/fatigue and recent imaging studies revealing extensive right pleural tumor with effusion and right pericardial and precarinal mediastinal adenopathy. 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 Request is now received for CT guided biopsy of the right pleural tumor and possible thoracentesis. Imaging studies were reviewed by Dr. Laurence Ferrari. At this time recommend only pleural mass biopsy since pt is not in resp distress secondary to effusion. Risk of additional bleeding could be increased with attempts to cross pleural masses for fluid removal. Once pathology back  and clinical picture more complete with oncology on board can then reevaluate need for possible thoracentesis vs pleurx cath placement. Details/risks of procedure d/w pt/family with their understanding and consent.       I spent a total of 20 minutes face to face in clinical  consultation, greater than 50% of which was counseling/coordinating care for right pleural based mass biopsy.  Signed: Autumn Messing 08/02/2014, 10:56 AM  Agree with PA note above.  Given degree of pleural disease, thoracentesis likely of limited utility as lung very likely encased by tumor.  Would expect minimal re-expansion and probable ex-vacuo pneumothorax.     Signed,  Criselda Peaches, MD

## 2014-08-02 NOTE — Procedures (Signed)
Interventional Radiology Procedure Note  Procedure: CT guided biopsy of right pleural mass Complications: None immediate Recommendations: - Bedrest x 2 hrs - Path pending  Signed,  Criselda Peaches, MD

## 2014-08-02 NOTE — Consult Note (Signed)
Reason for consult: right pleural mass biopsy, possible thoracentesis  Referring Physician(s): TRH  History of Present Illness: Chloe Snyder is a 68 y.o. female smoker with history of diminished appetite, weight loss, depression, cough, weakness/fatigue and recent imaging studies revealing extensive right pleural tumor with effusion and right pericardial and precarinal mediastinal adenopathy. 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 Request is now received for CT guided biopsy of the right pleural tumor and possible thoracentesis.   Past Medical History  Diagnosis Date  . DJD (degenerative joint disease)   . Insomnia   . Hyperlipidemia   . Heart murmur     per MD note - pt not aware of any murmur  . Dysrhythmia     Hx Afib per MD note - pt unaware of any hx of a fib  . History of skin cancer     Past Surgical History  Procedure Laterality Date  . Appendectomy    . Tonsillectomy    . Lumbar laminectomy/decompression microdiscectomy N/A 11/01/2013    Procedure:  MICRODISCECTOMY LUMBAR DECOMPRESSION L4-L5   ( 1 LEVEL);  Surgeon: Johnn Hai, MD;  Location: [facility];  Service: Orthopedics;  Laterality: N/A;    Allergies: Review of patient's allergies indicates no known allergies.  Medications: Prior to Admission medications   Medication Sig Start Date End Date Taking? Authorizing Provider  DULoxetine (CYMBALTA) 30 MG capsule Take 30 mg by mouth at bedtime.   Yes Historical Provider, MD  fentaNYL (DURAGESIC - DOSED MCG/HR) 25 MCG/HR patch Place 25 mcg onto the skin every 3 (three) days.   Yes Historical Provider, MD  traZODone (DESYREL) 100 MG tablet Take 100 mg by mouth at bedtime.   Yes Historical Provider, MD    History reviewed. No pertinent family history.  History   Social History  . Marital Status: Divorced    Spouse Name: N/A    Number of Children: N/A  . Years of Education: N/A   Social History Main Topics  . Smoking status: Current  Every Day Smoker -- 0.50 packs/day for 50 years  . Smokeless tobacco: Never Used  . Alcohol Use: No  . Drug Use: No  . Sexual Activity: None   Other Topics Concern  . None   Social History Narrative  . None         Review of Systems see above  Vital Signs: BP 119/47  Pulse 72  Temp(Src) 98.3 F (36.8 C) (Oral)  Resp 18  Ht 5\' 5"  (1.651 m)  Wt 111 lb (50.349 kg)  BMI 18.47 kg/m2  SpO2 93%  Physical Exam pt awake/alert, flat affect; cachetic appearing; family in room; chest- dim BS rt mid to lower lung field, left clear; heart- RRR; abd- soft,+BS,NT; ext- FROM, no edema.  Imaging: Ct Chest W Contrast  08/02/2014   CLINICAL DATA:  Pleural effusion on right, Dec appetite x few months; 54# wt loss x 3 mo; cough; sob; hypoxic 08/01/14; eval pl effusion/ adenopathy seen on CT @ [facility]; eval for underlying malignancy; LBP X 2 yr  EXAM: CT CHEST WITH CONTRAST  TECHNIQUE: Multidetector CT imaging of the chest was performed during intravenous contrast administration.  CONTRAST:  44mL OMNIPAQUE IOHEXOL 300 MG/ML  SOLN  COMPARISON:  Radiograph 07/25/2014 and earlier studies  FINDINGS: Moderate right pleural effusion with extensive nodular pleural thickening and confluent masses measuring up 3.5 cm.  Mediastinal adenopathy, precarinal node measured up to 2 cm short axis diameter. Confluent right  pericardial lymph node mass up to 2.3 cm short axis diameter.  No pericardial or left pleural effusion. No pleural plaques on the left.  Two small nodules in the right middle lobe measuring approximately 3 mm, images 27-28 sequence 5. There is extensive atelectasis throughout much of the right lower lobe limiting parenchymal evaluation. Emphysematous changes with subpleural blebs noted in both lung apices.  Mild plaque in the aortic arch and descending aorta.  Mild compression fracture deformities of T10 and superior endplate of L1, present since 10/27/2013.  IMPRESSION: 1. Extensive right pleural tumor  with effusion. Consideration includes mesothelioma versus extensive pleural metastatic disease. 2. Right pericardial and precarinal mediastinal adenopathy.   Electronically Signed   By: Arne Cleveland M.D.   On: 08/02/2014 09:38    Labs:  CBC:  Recent Labs  10/27/13 1440 08/01/14 1804  WBC 7.1 5.5  HGB 14.2 11.3*  HCT 41.5 34.8*  PLT 235 350    COAGS:  Recent Labs  08/01/14 1804  INR 1.07    BMP:  Recent Labs  10/27/13 1440 11/02/13 0525 08/01/14 1804  NA 140 137 138  K 4.3 3.9 5.2  CL 102 101 97  CO2 26 27 27   GLUCOSE 102* 122* 106*  BUN 8 6 13   CALCIUM 9.4 8.8 9.6  CREATININE 0.86 0.76 0.63  GFRNONAA 69* 86* >90  GFRAA 80* >90 >90    LIVER FUNCTION TESTS:  Recent Labs  08/01/14 1804  BILITOT 0.4  AST 22  ALT 6  ALKPHOS 78  PROT 7.6  ALBUMIN 3.0*    TUMOR MARKERS: No results found for this basename: AFPTM, CEA, CA199, CHROMGRNA,  in the last 8760 hours  Assessment and Plan: Chloe Snyder is a 68 y.o. female smoker with history of diminished appetite, weight loss, depression, cough, weakness/fatigue and recent imaging studies revealing extensive right pleural tumor with effusion and right pericardial and precarinal mediastinal adenopathy. 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 Request is now received for CT guided biopsy of the right pleural tumor and possible thoracentesis. Imaging studies were reviewed by Dr. Laurence Ferrari. At this time recommend only pleural mass biopsy since pt is not in resp distress secondary to effusion. Risk of additional bleeding could be increased with attempts to cross pleural masses for fluid removal. Once pathology back  and clinical picture more complete with oncology on board can then reevaluate need for possible thoracentesis vs pleurx cath placement. Details/risks of procedure d/w pt/family with their understanding and consent.       I spent a total of 20 minutes face to face in clinical  consultation, greater than 50% of which was counseling/coordinating care for right pleural based mass biopsy.  Signed: Autumn Messing 08/02/2014, 10:56 AM

## 2014-08-02 NOTE — Progress Notes (Addendum)
PROGRESS NOTE  Chloe Snyder EVO:[id number] DOB: 1946-05-19 DOA: 08/01/2014 PCP: [place] Pink, MD  HPI: Patient admitted on 10/7 from [facility] with weight loss and new diagnosis of probable metastatic lung cancer.  Subjective/ 24 H Interval events - feeling a bit better this morning, more energetic   Assessment/Plan: Probable metastatic lung cancer - CT scan quite impressive, IR consulted today for evaluation for biopsy and thoracentesis. She is getting biopsy today per IR but per Dr. Laurence Ferrari thoracentesis may be dificult right now due to extensive tumor involvement. I called thoracic surgery with Dr. Servando Snare and he will see patient in consultation for evaluation for possible VATS and will transfer patient to [facility] tonight for his evaluation.  - will set up with Oncology once biopsy is back, I briefly discussed with Dr. Julien Nordmann today over the phone  Spinal stenosis, lumbar region, with neurogenic claudication  Report low back pain and was receiving fentanyl patch at outside hospital  DJD (degenerative joint disease)  Had lumbar decompressive sx in january this year but did not participate in PT thereafter.  PT eval and pain control  Major depression  On cymbalta and trazodone as outpt. denies suicidal ideations.  Tobacco use disorder Ordered nicotine patch  Severe protein calorie malnutrition nutrition consult  Addendum: patient refusing transfer to [facility], wants to follow up with Oncology as an outpatient and go home in am. Discussed with patient who shows full understanding of her medical condition, si agreeable to follow up as an outpatient but wants nothing further done as inpatient. I also discussed with her daughter.  Diet: regular Fluids: NS DVT Prophylaxis: SCD  Code Status: Full Family Communication: d/w daughter bedside  Disposition Plan: inpatient  Consultants:  IR  Thoracic surgery  Procedures:  None    Antibiotics None     Studies  Filed Vitals:   08/01/14 1627 08/01/14 1700 08/01/14 2154 08/02/14 0517  BP: 98/54  103/44 119/47  Pulse: 71  67 72  Temp: 98.3 F (36.8 C)  98.7 F (37.1 C) 98.3 F (36.8 C)  TempSrc: Oral  Oral Oral  Resp: 18  18 18   Height:  5\' 5"  (1.651 m)    Weight:  50.349 kg (111 lb)    SpO2: 100%  94% 93%    Intake/Output Summary (Last 24 hours) at 08/02/14 1306 Last data filed at 08/02/14 0857  Gross per 24 hour  Intake      0 ml  Output      1 ml  Net     -1 ml   Filed Weights   08/01/14 1700  Weight: 50.349 kg (111 lb)   Exam:  General:  NAD  Cardiovascular: RRR  Respiratory: decreased breath sounds on right   Abdomen: soft, non tender  MSK: no edema  Neuro: non focal  Data Reviewed: Basic Metabolic Panel:  Recent Labs Lab 08/01/14 1804  NA 138  K 5.2  CL 97  CO2 27  GLUCOSE 106*  BUN 13  CREATININE 0.63  CALCIUM 9.6   Liver Function Tests:  Recent Labs Lab 08/01/14 1804  AST 22  ALT 6  ALKPHOS 78  BILITOT 0.4  PROT 7.6  ALBUMIN 3.0*   CBC:  Recent Labs Lab 08/01/14 1804  WBC 5.5  NEUTROABS 3.5  HGB 11.3*  HCT 34.8*  MCV 89.7  PLT 350   Studies: No results found.  No results found.  Scheduled Meds: . enoxaparin (LOVENOX) injection  40 mg Subcutaneous Q24H  .  morphine injection  2 mg Intravenous Q6H WA  . nicotine  14 mg Transdermal Daily  . traZODone  100 mg Oral QHS   Continuous Infusions: . sodium chloride 75 mL/hr at 08/02/14 0807    Principal Problem:   Pleural effusion on right Active Problems:   Spinal stenosis, lumbar region, with neurogenic claudication   DJD (degenerative joint disease)   Major depression   Loss of weight   Tobacco use disorder   Protein-calorie malnutrition, severe   Time spent: [place], MD [facility] Pager [phone]. If 7 PM - 7 AM, please contact night-coverage at [email or web address], password [facility] 08/02/2014, 1:06 PM  LOS: 1 day

## 2014-08-02 NOTE — Progress Notes (Signed)
Patient refusing  iv restart.  Currently refusing any procedures.  States that she wants to eat, and go home.  Durwin Nora RN

## 2014-08-02 NOTE — Evaluation (Signed)
Physical Therapy Evaluation Patient Details Name: Chloe Snyder MRN: [id number] DOB: [birth year] Today's Date: 08/02/2014   History of Present Illness  68 yo female admitted with plerual effusion. Hx of lumbar surgery 10/2013, Afib neurogenic claudication. Pt lives alone  Clinical Impression  Limited eval due to pt somewhat upset about current diet and she did not wish to mobilize very much at this time. On eval, pt was Min guard assist for mobility-able to ambulate ~60 feet while holding onto hallway handrail. Unsteady at times. Recommended to pt that she use RW until stability/strength improve. Pt reports she has a walker available at home. Son/daughter present during eval. Son states he will be able to assist pt at home for a short period of time-he is visiting from out of town. Recommend HHPT.     Follow Up Recommendations Home health PT;Supervision - Intermittent    Equipment Recommendations  None recommended by PT    Recommendations for Other Services       Precautions / Restrictions Precautions Precautions: Fall Restrictions Weight Bearing Restrictions: No      Mobility  Bed Mobility Overal bed mobility: Modified Independent                Transfers Overall transfer level: Needs assistance   Transfers: Sit to/from Stand Sit to Stand: Min guard            Ambulation/Gait Ambulation/Gait assistance: Min guard Ambulation Distance (Feet): 60 Feet Assistive device: None (hallway handrail) Gait Pattern/deviations: Step-through pattern;Trunk flexed;Decreased stride length     General Gait Details: fatigues easily. unsteady at times. Pt declined to ambulate any farther  [employer]    Modified Rankin (Stroke Patients Only)       Balance Overall balance assessment: Needs assistance         Standing balance support: Single extremity supported;During functional activity Standing balance-Leahy Scale: Fair                                Pertinent Vitals/Pain Pain Assessment: 0-10 Pain Score: 7  Pain Location: back chronic Pain Intervention(s): Monitored during session;Limited activity within patient's tolerance    Home Living Family/patient expects to be discharged to:: Private residence Living Arrangements: Alone Available Help at Discharge: Family Type of Home: House Home Access: Level entry     Home Layout: One level Home Equipment: Environmental consultant - 2 wheels;Cane - single point      Prior Function Level of Independence: Independent               Hand Dominance        Extremity/Trunk Assessment   Upper Extremity Assessment: Generalized weakness           Lower Extremity Assessment: Generalized weakness      Cervical / Trunk Assessment: Kyphotic  Communication   Communication: No difficulties  Cognition Arousal/Alertness: Awake/alert Behavior During Therapy: WFL for tasks assessed/performed Overall Cognitive Status: Within Functional Limits for tasks assessed                      General Comments      Exercises        Assessment/Plan    PT Assessment Patient needs continued PT services  PT Diagnosis Difficulty walking;Generalized weakness   PT Problem List Decreased strength;Decreased activity tolerance;Decreased balance;Decreased mobility;Pain;Decreased knowledge of use of DME  PT Treatment  Interventions DME instruction;Gait training;Functional mobility training;Therapeutic activities;Therapeutic exercise;Patient/family education;Balance training   PT Goals (Current goals can be found in the Care Plan section) Acute Rehab PT Goals Patient Stated Goal: to get something to eat PT Goal Formulation: With patient Time For Goal Achievement: 08/16/14 Potential to Achieve Goals: Good    Frequency Min 3X/week   Barriers to discharge        Co-evaluation               End of Session Equipment Utilized During Treatment: Gait  belt Activity Tolerance: Patient limited by fatigue Patient left: in bed;with call bell/phone within reach;with family/visitor present           Time: 4128-7867 PT Time Calculation (min): 9 min   Charges:   PT Evaluation $Initial PT Evaluation Tier I: 1 Procedure PT Treatments $Gait Training: 8-22 mins   PT G Codes:          Weston Anna, MPT Pager: [phone]

## 2014-08-02 NOTE — Progress Notes (Signed)
INITIAL NUTRITION ASSESSMENT  Pt meets criteria for severe MALNUTRITION in the context of chronic illness as evidenced by 33% weight loss in the past 4 months per family report in addition to pt with severe muscle wasting and subcutaneous fat loss throughout body.  DOCUMENTATION CODES Per approved criteria  -Severe malnutrition in the context of chronic illness -Underweight   INTERVENTION: - Diet advancement per MD - Recommend Carnation Instant Breakfast BID once diet advanced - Encouraged small frequent high protein/high calorie foods and beverages to promote weight gain and strength building - RD to continue to monitor   NUTRITION DIAGNOSIS: Inadequate oral intake related to inability to eat as evidenced by NPO.   Goal: Advance diet as tolerated to regular diet  Monitor:  Weights, labs, diet advancement  Reason for Assessment: Malnutrition screening tool, consult for assessment   68 y.o. female  Admitting Dx: Pleural effusion on right  ASSESSMENT: Pt with depression, tobacco use, lumbar spinal stenosis with decompression surgery early this year was admitted to [facility] about 1 and half week back. She was admitted there as daughter was very concerned about her health since patient was not getting out of bed, appeared depressed and had poor appetite for past few months. Found to have right pleural effusion.  - Pt discussed during multidisciplinary rounds - Met with pt and family who report pt with 54 pound unintended weight loss since June of this year - Pt reports she wasn't eating meals, just snacking on things like soups, fruit, candy, cookies, and donuts and drinking coke, fruit punch, and water - Tried Ensure and Boost in the past but didn't like it however agreeable to trying [employer] when diet advanced  Nutrition Focused Physical Exam:  Subcutaneous Fat:  Orbital Region: mild wasting Upper Arm Region: severe wasting Thoracic and  Lumbar Region: severe wasting  Muscle:  Temple Region: mild wasting Clavicle Bone Region: severe wasting Clavicle and Acromion Bone Region: severe wasting Scapular Bone Region: severe wasting Dorsal Hand: severe wasting Patellar Region: severe wasting Anterior Thigh Region: severe wasting Posterior Calf Region: severe wasting  Edema: none    Height: Ht Readings from Last 1 Encounters:  08/01/14 5' 5"  (1.651 m)    Weight: Wt Readings from Last 1 Encounters:  08/01/14 111 lb (50.349 kg)    Ideal Body Weight: 125 lbs  % Ideal Body Weight: 89%  Wt Readings from Last 10 Encounters:  08/01/14 111 lb (50.349 kg)  11/01/13 138 lb 14.2 oz (63 kg)  11/01/13 138 lb 14.2 oz (63 kg)  10/27/13 139 lb 8 oz (63.277 kg)    Usual Body Weight: 165 lbs per family  % Usual Body Weight: 67%  BMI:  Body mass index is 18.47 kg/(m^2). Underweight  Estimated Nutritional Needs: Kcal: 1550-1750 Protein: 65-85g Fluid: 1.5-1.7L/day   Skin: intact  Diet Order: NPO  EDUCATION NEEDS: -No education needs identified at this time   Intake/Output Summary (Last 24 hours) at 08/02/14 0952 Last data filed at 08/02/14 0857  Gross per 24 hour  Intake      0 ml  Output      1 ml  Net     -1 ml    Last BM: PTA  Labs:   Recent Labs Lab 08/01/14 1804  NA 138  K 5.2  CL 97  CO2 27  BUN 13  CREATININE 0.63  CALCIUM 9.6  GLUCOSE 106*    CBG (last 3)  No results found for this basename: GLUCAP,  in the last 72 hours  Scheduled Meds: . enoxaparin (LOVENOX) injection  40 mg Subcutaneous Q24H  .  morphine injection  2 mg Intravenous Q6H WA  . nicotine  14 mg Transdermal Daily  . traZODone  100 mg Oral QHS    Continuous Infusions: . sodium chloride 75 mL/hr at 08/02/14 0122    Past Medical History  Diagnosis Date  . DJD (degenerative joint disease)   . Insomnia   . Hyperlipidemia   . Heart murmur     per MD note - pt not aware of any murmur  . Dysrhythmia     Hx Afib  per MD note - pt unaware of any hx of a fib  . History of skin cancer     Past Surgical History  Procedure Laterality Date  . Appendectomy    . Tonsillectomy    . Lumbar laminectomy/decompression microdiscectomy N/A 11/01/2013    Procedure:  MICRODISCECTOMY LUMBAR DECOMPRESSION L4-L5   ( 1 LEVEL);  Surgeon: Johnn Hai, MD;  Location: [facility];  Service: Orthopedics;  Laterality: N/A;    Carlis Stable MS, [place], LDN [phone] Pager [phone] Weekend/After Hours Pager

## 2014-08-03 ENCOUNTER — Telehealth: Payer: Self-pay | Admitting: Internal Medicine

## 2014-08-03 ENCOUNTER — Encounter (HOSPITAL_COMMUNITY)
Admission: AD | Disposition: A | Discharge status: Home Health | Payer: Self-pay | Source: Other Acute Inpatient Hospital | Attending: Internal Medicine

## 2014-08-03 LAB — CBC
HEMATOCRIT: 30 % — AB (ref 36.0–46.0)
Hemoglobin: 10 g/dL — ABNORMAL LOW (ref 12.0–15.0)
MCH: 29.6 pg (ref 26.0–34.0)
MCHC: 33.3 g/dL (ref 30.0–36.0)
MCV: 88.8 fL (ref 78.0–100.0)
Platelets: 317 10*3/uL (ref 150–400)
RBC: 3.38 MIL/uL — ABNORMAL LOW (ref 3.87–5.11)
RDW: 12.8 % (ref 11.5–15.5)
WBC: 6.1 10*3/uL (ref 4.0–10.5)

## 2014-08-03 LAB — COMPREHENSIVE METABOLIC PANEL
ALBUMIN: 2.4 g/dL — AB (ref 3.5–5.2)
ALK PHOS: 63 U/L (ref 39–117)
ALT: 6 U/L (ref 0–35)
AST: 17 U/L (ref 0–37)
Anion gap: 11 (ref 5–15)
BUN: 10 mg/dL (ref 6–23)
CO2: 25 meq/L (ref 19–32)
Calcium: 8.7 mg/dL (ref 8.4–10.5)
Chloride: 102 mEq/L (ref 96–112)
Creatinine, Ser: 0.63 mg/dL (ref 0.50–1.10)
GFR calc Af Amer: >90 mL/min (ref >90)
GFR calc non Af Amer: >90 mL/min (ref >90)
GLUCOSE: 107 mg/dL — AB (ref 70–99)
POTASSIUM: 4.2 meq/L (ref 3.7–5.3)
Sodium: 138 mEq/L (ref 137–147)
Total Bilirubin: 0.3 mg/dL (ref 0.3–1.2)
Total Protein: 6.3 g/dL (ref 6.0–8.3)

## 2014-08-03 SURGERY — VIDEO ASSISTED THORACOSCOPY (VATS) W/TALC PLEUADESIS
Anesthesia: General | Site: Chest | Laterality: Right

## 2014-08-03 MED ORDER — TRAZODONE HCL 100 MG PO TABS: 100.0000 mg | ORAL_TABLET | Freq: Every day | ORAL | Status: AC

## 2014-08-03 MED ORDER — HYDROCODONE-ACETAMINOPHEN 5-325 MG PO TABS: 1.0000 | ORAL_TABLET | Freq: Four times a day (QID) | ORAL | Status: DC | PRN

## 2014-08-03 MED ORDER — DULOXETINE HCL 30 MG PO CPEP: 30.0000 mg | ORAL_CAPSULE | Freq: Every day | ORAL | Status: AC

## 2014-08-03 MED ORDER — FENTANYL 25 MCG/HR TD PT72: 25.0000 ug | MEDICATED_PATCH | TRANSDERMAL | Status: DC

## 2014-08-03 MED ORDER — NICOTINE 14 MG/24HR TD PT24: 14.0000 mg | MEDICATED_PATCH | Freq: Every day | TRANSDERMAL | Status: AC

## 2014-08-03 NOTE — Care Management Note (Signed)
    Page 1 of 1   08/03/2014     4:21:36 PM CARE MANAGEMENT NOTE 08/03/2014  Patient:  Chloe Snyder, Chloe Snyder   Account Number:  [id number]  Date Initiated:  08/03/2014  Documentation initiated by:  Dessa Phi  Subjective/Objective Assessment:   68 Y/O F ADMITTED W/PLEURAL EFFUSION     Action/Plan:   FROM HOME.   Anticipated DC Date:  08/03/2014   Anticipated DC Plan:  [place]  CM consult      Choice offered to / List presented to:  C-1 Patient        [place] arranged  [facility]      [place].   Status of service:  Completed, signed off Medicare Important Message given?   (If response is "NO", the following Medicare IM given date fields will be blank) Date Medicare IM given:   Medicare IM given by:   Date Additional Medicare IM given:   Additional Medicare IM given by:    Discharge Disposition:  [place]  Per UR Regulation:  Reviewed for med. necessity/level of care/duration of stay  If discussed at Long Length of Stay Meetings, dates discussed:    Comments:  08/03/14 Quinntin Malter RN,BSN NCM [phone] [place].KRISTEN REP AWARE.NO FURTHER D/C NEEDS.

## 2014-08-03 NOTE — Progress Notes (Signed)
08/03/14  1040  Reviewed discharge instructions with patient. Patient verbalized understanding of discharge instructions. Copy of discharge instructions and prescriptions given to patient.

## 2014-08-03 NOTE — Telephone Encounter (Signed)
C/D 08/03/14 for appt. 08/08/14

## 2014-08-03 NOTE — Discharge Summary (Signed)
Physician Discharge Summary  Chloe Snyder:[id number] DOB: 14-Jul-1946 DOA: 08/01/2014  PCP: Maryland Pink, MD  Admit date: 08/01/2014 Discharge date: 08/03/2014  Time spent: 45 minutes  Recommendations for Outpatient Follow-up:  1. Follow up with PCP in 1-2 weeks 2. Follow up with Dr. Julien Nordmann next week. Appointment has been made on Wednesday 10/14 at 11 am.   Discharge Diagnoses:  Principal Problem:   Pleural effusion on right Active Problems:   Spinal stenosis, lumbar region, with neurogenic claudication   DJD (degenerative joint disease)   Major depression   Loss of weight   Tobacco use disorder   Protein-calorie malnutrition, severe  Discharge Condition: guarded  Diet recommendation: regular   Filed Weights   08/01/14 1700  Weight: 50.349 kg (111 lb)   History of present illness:  68 y/o female with depression, tobacco use, lumbar spinal stenosis with decompression surgery early this year was admitted to [facility] about 1 and half week back. She was admitted there as daughter was very concerned about her health since patient was not getting out of bed, appeared depressed and had poor appetite for past few months. . The daughter was helping her with her groceries and cleaning but patient had been sitting in her bed or couch all day and not wanting to participate in anything. She took her to her PCP and found to have 54 lb weight loss more evident in last 3 months. She also has been coughing frequently. Her PCP referred her to be admitted to [facility]. As per daughter pt had CXR done there and given her severe depression she was involuntarily committed and sent to [facility]. Her labs at [facility] were unremarkable ( CBC, BMET ), had low B12 of 250. Head CT was unremarkable. A CT of the chest without contrat was done given right pleural effusion seen on earlier CXR and showed which showed "extensive lobulated pleural thickening and  multiple pleural based masses in the right hemithorax consistent with pleural spread of malignancy. There was also a moderate size right pleural effusion and compressive atelectasis of a portion of the right lower lobe. Patient also had hilar and mediastinal adenopathy. She also had a lumbar CT scan which showed minimal disc bulge and facet arthrosis at L1-L2 and minimal lateral disc bulging and facet arthrosis with at L2-L3 with disc bulging and slight lateral endplate arthrosis at Q7-R9." Patient denies headache, dizziness, fever, chills, nausea , vomiting, chest pain, palpitations, SOB, abdominal pain, bowel or urinary symptoms. He reports generalized weakness and back pain. Reports poor appetite. He reports being depressed but denies any suicidal ideations. She usually answers with either yes or a no and does not give detailed answers. As for the physician at [facility] patient was hypoxic this morning and given her CT findings they do not have further resource or subspeciality to evaluate her pleural effusion further and as per her daughter's request to have patient transferred to [facility], hospitalist was consulted and patient accepted to [place] [facility] Course:  Patient was admitted to [facility] as a transfer from [facility] where she was hospitalized for depression and weight loss. On admission patient had a repeat CT chest with contrast which showed extensive right pleural tumor with effusion as well as mediastinal adenopathy likely representing mesothelioma vs metastatic lung cancer. IR was consulted and patient underwent a CT guided biopsy on 10/8 with pathology pending at the time of this summary. Given extensive right hemothorax involvement with effusion  and pleural tumor burden thoracic surgery was consulted (Dr. Servando Snare) and plans were initiated to transfer patient to [facility] for further evaluation and potential VATS. Patient however refused transfer and  will not allow further inpatient testing and wants most of her medical problems to be further addressed as an outpatient. I set up an appointment with Dr. Julien Nordmann in Oncology who will see patient in 5 days as initial visit, and biopsy results should be available by then. Patient was remarkably asymptomatic from her pleural effusion suggesting a somewhat of a chronic process, comfortable on room air and able to ambulate without chest pain or dyspnea. She was extensively counseled that if she becomes progressively short of breath between now and when she will be seen in Oncology clinic to present to the ED right away. She expressed understanding and this was communicated to patient's daughter as well.  Spinal stenosis, lumbar region, with neurogenic claudication Report low back pain and was receiving fentanyl patch at outside hospital  DJD (degenerative joint disease) Had lumbar decompressive sx in january this year but did not participate in PT thereafter.  PT evaluated patient in the hospital and recommended HHPT. Major depression On cymbalta and trazodone as outpatient. I discussed her case with the psychiatrist at [facility] prior to transfer and he did not feel like she needs further inpatient psychiatric treatment. Patient states that her depression is much better, denies suicidal ideation and will live with her son for now. She has good understanding of her medical issues and plans to follow up with Oncology next week. Tobacco use disorder Ordered nicotine patch  Severe protein calorie malnutrition nutrition consult  Procedures:  IR guided biopsy    Consultations:  IR  Discharge Exam: Filed Vitals:   08/02/14 0517 08/02/14 1341 08/02/14 2101 08/03/14 0617  BP: 119/47 107/37 105/42 105/44  Pulse: 72 64 65 63  Temp: 98.3 F (36.8 C) 98.3 F (36.8 C) 99.1 F (37.3 C) 98.5 F (36.9 C)  TempSrc: Oral Oral Oral Oral  Resp: 18 18 16 16   Height:      Weight:      SpO2: 93% 96%  94% 96%    General: NAD Cardiovascular: RRR Respiratory: decreased breath sounds on right   Discharge Instructions     Medication List         DULoxetine 30 MG capsule  Commonly known as:  CYMBALTA  Take 1 capsule (30 mg total) by mouth at bedtime.     fentaNYL 25 MCG/HR patch  Commonly known as:  DURAGESIC - dosed mcg/hr  Place 1 patch (25 mcg total) onto the skin every 3 (three) days.     HYDROcodone-acetaminophen 5-325 MG per tablet  Commonly known as:  NORCO/VICODIN  Take 1 tablet by mouth every 6 (six) hours as needed for moderate pain.     nicotine 14 mg/24hr patch  Commonly known as:  NICODERM CQ - dosed in mg/24 hours  Place 1 patch (14 mg total) onto the skin daily.     traZODone 100 MG tablet  Commonly known as:  DESYREL  Take 1 tablet (100 mg total) by mouth at bedtime.           Follow-up Information   Follow up with HEDRICK,JAMES, MD. Schedule an appointment as soon as possible for a visit in 1 week.   Specialty:  Family Medicine   Contact information:   [address] [place] [address] [phone]  The results of significant diagnostics from this hospitalization (including imaging, microbiology, ancillary and laboratory) are listed below for reference.    Significant Diagnostic Studies: Ct Chest W Contrast  08/02/2014   CLINICAL DATA:  Pleural effusion on right, Dec appetite x few months; 54# wt loss x 3 mo; cough; sob; hypoxic 08/01/14; eval pl effusion/ adenopathy seen on CT @ [facility]; eval for underlying malignancy; LBP X 2 yr  EXAM: CT CHEST WITH CONTRAST  TECHNIQUE: Multidetector CT imaging of the chest was performed during intravenous contrast administration.  CONTRAST:  71mL OMNIPAQUE IOHEXOL 300 MG/ML  SOLN  COMPARISON:  Radiograph 07/25/2014 and earlier studies  FINDINGS: Moderate right pleural effusion with extensive nodular pleural thickening and confluent masses measuring up 3.5 cm.  Mediastinal adenopathy,  precarinal node measured up to 2 cm short axis diameter. Confluent right pericardial lymph node mass up to 2.3 cm short axis diameter.  No pericardial or left pleural effusion. No pleural plaques on the left.  Two small nodules in the right middle lobe measuring approximately 3 mm, images 27-28 sequence 5. There is extensive atelectasis throughout much of the right lower lobe limiting parenchymal evaluation. Emphysematous changes with subpleural blebs noted in both lung apices.  Mild plaque in the aortic arch and descending aorta.  Mild compression fracture deformities of T10 and superior endplate of L1, present since 10/27/2013.  IMPRESSION: 1. Extensive right pleural tumor with effusion. Consideration includes mesothelioma versus extensive pleural metastatic disease. 2. Right pericardial and precarinal mediastinal adenopathy.   Electronically Signed   By: Arne Cleveland M.D.   On: 08/02/2014 09:38   Ct Biopsy  08/02/2014   CLINICAL DATA:  68 year old female with newly diagnosed mass-like pleural thickening and presumed malignant pleural effusion on the right. She presents for CT-guided biopsy to facilitate tissue diagnosis.  EXAM: CT BIOPSY  Date: 08/02/2014  PROCEDURE: 1. CT-guided biopsy of masslike thickening of the right pleura Interventional Radiologist:  Criselda Peaches, MD  ANESTHESIA/SEDATION: None administered  TECHNIQUE: Informed consent was obtained from the patient following explanation of the procedure, risks, benefits and alternatives. The patient understands, agrees and consents for the procedure. All questions were addressed. A time out was performed.  A planning axial CT scan was performed in the regions of masslike pleural thickening were evaluated. A suitable skin entry site was selected and marked. The region was sterilely prepped and draped in the standard fashion using Betadine skin prep. Local anesthesia was attained by infiltration with 1% lidocaine. A small dermatotomy was made.  Using intermittent CT fluoroscopic guidance, a 17 gauge trocar needle was advanced through the right back and into the margin of the mass like pleural thickening. Multiple 18 gauge core biopsies were then obtained using the BioPince automated biopsy device. Biopsy specimens were placed in saline and sent to pathology for further analysis.  The biopsy device and trocar needle were removed. Post biopsy axial CT imaging demonstrates no hematoma or evidence of complication. The patient tolerated the procedure well.  COMPLICATIONS: None.  IMPRESSION: Technically successful CT-guided core biopsy of masslike pleural thickening.  Signed,  Criselda Peaches, MD  [facility]  [facility]   Electronically Signed   By: Jacqulynn Cadet M.D.   On: 08/02/2014 16:46   Labs: Basic Metabolic Panel:  Recent Labs Lab 08/01/14 1804 08/03/14 0500  NA 138 138  K 5.2 4.2  CL 97 102  CO2 27 25  GLUCOSE 106* 107*  BUN 13 10  CREATININE 0.63 0.63  CALCIUM 9.6 8.7   Liver Function Tests:  Recent Labs Lab 08/01/14 1804 08/03/14 0500  AST 22 17  ALT 6 6  ALKPHOS 78 63  BILITOT 0.4 0.3  PROT 7.6 6.3  ALBUMIN 3.0* 2.4*   CBC:  Recent Labs Lab 08/01/14 1804 08/03/14 0500  WBC 5.5 6.1  NEUTROABS 3.5  --   HGB 11.3* 10.0*  HCT 34.8* 30.0*  MCV 89.7 88.8  PLT 350 317   Signed:  Izaias Krupka  [facility] 08/03/2014, 4:08 PM

## 2014-08-03 NOTE — Discharge Instructions (Signed)

## 2014-08-03 NOTE — Telephone Encounter (Signed)
S/W DR. GHERGHE AND GAVE NP APPT FOR 10/14 @ 11 W/DR. MOHAMED.  DX-METS LUNG CA

## 2014-08-07 ENCOUNTER — Telehealth: Payer: Self-pay | Admitting: Nurse Practitioner

## 2014-08-07 NOTE — Telephone Encounter (Signed)
This RN called to remind patient about new appt and to bring meds on 08/08/14 but no answer and no voicemail set up.

## 2014-08-08 ENCOUNTER — Other Ambulatory Visit: Payer: Self-pay | Admitting: Medical Oncology

## 2014-08-08 ENCOUNTER — Encounter: Payer: Self-pay | Admitting: Internal Medicine

## 2014-08-08 ENCOUNTER — Ambulatory Visit: Payer: Medicare HMO

## 2014-08-08 ENCOUNTER — Telehealth: Payer: Self-pay | Admitting: Internal Medicine

## 2014-08-08 ENCOUNTER — Telehealth: Payer: Self-pay | Admitting: *Deleted

## 2014-08-08 ENCOUNTER — Other Ambulatory Visit: Payer: Medicare HMO

## 2014-08-08 ENCOUNTER — Encounter: Payer: Self-pay | Admitting: *Deleted

## 2014-08-08 ENCOUNTER — Ambulatory Visit (HOSPITAL_BASED_OUTPATIENT_CLINIC_OR_DEPARTMENT_OTHER): Payer: Medicare HMO | Admitting: Internal Medicine

## 2014-08-08 ENCOUNTER — Other Ambulatory Visit: Payer: Self-pay | Admitting: Internal Medicine

## 2014-08-08 VITALS — BP 108/51 | HR 78 | Temp 98.7°F | Resp 18 | Ht 65.0 in | Wt 106.0 lb

## 2014-08-08 DIAGNOSIS — C3490 Malignant neoplasm of unspecified part of unspecified bronchus or lung: Secondary | ICD-10-CM

## 2014-08-08 DIAGNOSIS — J90 Pleural effusion, not elsewhere classified: Secondary | ICD-10-CM

## 2014-08-08 DIAGNOSIS — C782 Secondary malignant neoplasm of pleura: Secondary | ICD-10-CM

## 2014-08-08 DIAGNOSIS — C3491 Malignant neoplasm of unspecified part of right bronchus or lung: Secondary | ICD-10-CM

## 2014-08-08 MED ORDER — PROCHLORPERAZINE MALEATE 10 MG PO TABS: 10.0000 mg | ORAL_TABLET | Freq: Four times a day (QID) | ORAL | Status: AC | PRN

## 2014-08-08 NOTE — Telephone Encounter (Signed)
Per staff message and POF I have scheduled appts. Advised scheduler of appts. JMW  

## 2014-08-08 NOTE — Progress Notes (Signed)
Checked in new patient with on financial issues prior to seeing the dr. She has appt card and [place] been [place] of the [place]

## 2014-08-08 NOTE — Progress Notes (Signed)
[place] [phone]   [phone]  CONSULT NOTE  REFERRING PHYSICIAN: Dr. Marzetta Board  REASON FOR CONSULTATION:  68 years old white female recently diagnosed with lung cancer.  HPI Chloe Snyder is a 68 y.o. female was past medical history significant for severe depression as well as history of spinal stenosis, degenerative joint disease, dyslipidemia, atrial fibrillation, history of skin cancer and malnutrition. The patient was committed to a mental health facility because of her severe depression. She also has been complaining of increasing shortness of breath and significant weight loss of 54 pounds over the last 3-4 months. She had chest x-ray that showed abnormality in the right lung. She was admitted to [facility] for further evaluation and CT scan of the chest was performed on 08/02/2014. It showed moderate right pleural effusion with extensive nodular pleural thickening and confluent masses measuring up to 3.5 CM. There is mediastinal adenopathy, precarinal node measured up to 2.0 CM short axis. There was confluent right pericardial lymph node mass up to 2.3 CM in short axis diameter. There was also 2 small nodules in the right middle lobe measuring approximately 3 mm and extensive atelectasis throughout much of the right lower lobe limiting parenchymal evaluation. The patient also had mild compression fracture deformities at T10 and superior endplate of L1 that were present since 10/27/2013. On 08/02/2014 the patient underwent CT-guided biopsy of the masslike thickening of the right pleura by interventional radiology. The final pathology (Accession: [id number]) was consistent with a small cell carcinoma. There are high grade malignant cells with very high N/C ratio, inconspicuous nucleoli and scanty cytoplasm. Immunostains were performed and the malignant cells are positive for CK-7, TTF-1, synaptophysin, p63 and patchy positive  for p63, negative for CK5/6, CK20, CDX-2 and calretinin with appropriate controls. 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 The patient was referred to me today for further evaluation and recommendation regarding treatment of her condition. When seen today she is feeling fine except for intermittent shortness of breath. She denied having any significant chest pain, cough or hemoptysis. The patient lost around 54 pounds over the last 3-4 months. She denied having any significant headache or visual changes. She has no nausea or vomiting but has constipation. Her family history significant for a mother who died from a stroke and had hypertension. She doesn't know much about her father. The patient is single and has 2 children. She was accompanied today by her daughter Chloe Snyder and her son Chloe Snyder. She is to work as a Statistician. She has a history of smoking more than one pack per day for around 50 years and she quit 2 weeks ago. She has no history of alcohol or drug abuse. HPI  Past Medical History  Diagnosis Date  . DJD (degenerative joint disease)   . Insomnia   . Hyperlipidemia   . Heart murmur     per MD note - pt not aware of any murmur  . Dysrhythmia     Hx Afib per MD note - pt unaware of any hx of a fib  . History of skin cancer     Past Surgical History  Procedure Laterality Date  . Appendectomy    . Tonsillectomy    . Lumbar laminectomy/decompression microdiscectomy N/A 11/01/2013    Procedure:  MICRODISCECTOMY LUMBAR DECOMPRESSION L4-L5   ( 1 LEVEL);  Surgeon: Johnn Hai, MD;  Location: [facility];  Service: Orthopedics;  Laterality: N/A;    History reviewed. No pertinent family history.  Social History History  Substance Use Topics  . Smoking status: Former Smoker -- 0.50 packs/day for 50 years    Quit date: 07/23/2014  . Smokeless tobacco: Never Used  . Alcohol Use: No    No Known Allergies  Current Outpatient  Prescriptions  Medication Sig Dispense Refill  . DULoxetine (CYMBALTA) 30 MG capsule Take 1 capsule (30 mg total) by mouth at bedtime.  30 capsule  1  . fentaNYL (DURAGESIC - DOSED MCG/HR) 25 MCG/HR patch Place 1 patch (25 mcg total) onto the skin every 3 (three) days.  3 patch  0  . HYDROcodone-acetaminophen (NORCO/VICODIN) 5-325 MG per tablet Take 1 tablet by mouth every 6 (six) hours as needed for moderate pain.  10 tablet  0  . traZODone (DESYREL) 100 MG tablet Take 1 tablet (100 mg total) by mouth at bedtime.  30 tablet  1  . nicotine (NICODERM CQ - DOSED IN MG/24 HOURS) 14 mg/24hr patch Place 1 patch (14 mg total) onto the skin daily.  28 patch  0  . prochlorperazine (COMPAZINE) 10 MG tablet Take 1 tablet (10 mg total) by mouth every 6 (six) hours as needed for nausea or vomiting.  30 tablet  0   No current facility-administered medications for this visit.    Review of Systems  Constitutional: positive for anorexia, fatigue and weight loss Eyes: negative Ears, nose, mouth, throat, and face: negative Respiratory: positive for dyspnea on exertion Cardiovascular: negative Gastrointestinal: negative Genitourinary:negative Integument/breast: negative Hematologic/lymphatic: negative Musculoskeletal:positive for muscle weakness Neurological: negative Behavioral/Psych: negative Endocrine: negative Allergic/Immunologic: negative  Physical Exam  XHB:ZJIRC, healthy, no distress, well nourished and well developed SKIN: skin color, texture, turgor are normal, no rashes or significant lesions HEAD: Normocephalic, No masses, lesions, tenderness or abnormalities EYES: normal, PERRLA EARS: External ears normal, Canals clear OROPHARYNX:no exudate, no erythema and lips, buccal mucosa, and tongue normal  NECK: supple, no adenopathy, no JVD LYMPH:  no palpable lymphadenopathy, no hepatosplenomegaly BREAST:not examined LUNGS: decreased breath sounds HEART: regular rate & rhythm, no murmurs  and no gallops ABDOMEN:abdomen soft, non-tender, normal bowel sounds and no masses or organomegaly BACK: Back symmetric, no curvature., No CVA tenderness EXTREMITIES:no joint deformities, effusion, or inflammation, no edema, no skin discoloration, no clubbing  NEURO: alert & oriented x 3 with fluent speech, no focal motor/sensory deficits  PERFORMANCE STATUS: ECOG 2  LABORATORY DATA: Lab Results  Component Value Date   WBC 6.1 08/03/2014   HGB 10.0* 08/03/2014   HCT 30.0* 08/03/2014   MCV 88.8 08/03/2014   PLT 317 08/03/2014      Chemistry      Component Value Date/Time   NA 138 08/03/2014 0500   K 4.2 08/03/2014 0500   CL 102 08/03/2014 0500   CO2 25 08/03/2014 0500   BUN 10 08/03/2014 0500   CREATININE 0.63 08/03/2014 0500      Component Value Date/Time   CALCIUM 8.7 08/03/2014 0500   ALKPHOS 63 08/03/2014 0500   AST 17 08/03/2014 0500   ALT 6 08/03/2014 0500   BILITOT 0.3 08/03/2014 0500       RADIOGRAPHIC STUDIES: Ct Chest W Contrast  08/02/2014   CLINICAL DATA:  Pleural effusion on right, Dec appetite x few months; 54# wt loss x 3 mo; cough; sob; hypoxic 08/01/14; eval pl effusion/ adenopathy seen on CT @ [facility]; eval for underlying malignancy; LBP X 2 yr  EXAM: CT CHEST WITH CONTRAST  TECHNIQUE: Multidetector CT imaging of the chest was performed during intravenous contrast administration.  CONTRAST:  63mL OMNIPAQUE IOHEXOL 300 MG/ML  SOLN  COMPARISON:  Radiograph 07/25/2014 and earlier studies  FINDINGS: Moderate right pleural effusion with extensive nodular pleural thickening and confluent masses measuring up 3.5 cm.  Mediastinal adenopathy, precarinal node measured up to 2 cm short axis diameter. Confluent right pericardial lymph node mass up to 2.3 cm short axis diameter.  No pericardial or left pleural effusion. No pleural plaques on the left.  Two small nodules in the right middle lobe measuring approximately 3 mm, images 27-28 sequence 5. There is extensive atelectasis throughout  much of the right lower lobe limiting parenchymal evaluation. Emphysematous changes with subpleural blebs noted in both lung apices.  Mild plaque in the aortic arch and descending aorta.  Mild compression fracture deformities of T10 and superior endplate of L1, present since 10/27/2013.  IMPRESSION: 1. Extensive right pleural tumor with effusion. Consideration includes mesothelioma versus extensive pleural metastatic disease. 2. Right pericardial and precarinal mediastinal adenopathy.   Electronically Signed   By: Arne Cleveland M.D.   On: 08/02/2014 09:38   Ct Biopsy  08/02/2014   CLINICAL DATA:  68 year old female with newly diagnosed mass-like pleural thickening and presumed malignant pleural effusion on the right. She presents for CT-guided biopsy to facilitate tissue diagnosis.  EXAM: CT BIOPSY  Date: 08/02/2014  PROCEDURE: 1. CT-guided biopsy of masslike thickening of the right pleura Interventional Radiologist:  Criselda Peaches, MD  ANESTHESIA/SEDATION: None administered  TECHNIQUE: Informed consent was obtained from the patient following explanation of the procedure, risks, benefits and alternatives. The patient understands, agrees and consents for the procedure. All questions were addressed. A time out was performed.  A planning axial CT scan was performed in the regions of masslike pleural thickening were evaluated. A suitable skin entry site was selected and marked. The region was sterilely prepped and draped in the standard fashion using Betadine skin prep. Local anesthesia was attained by infiltration with 1% lidocaine. A small dermatotomy was made. Using intermittent CT fluoroscopic guidance, a 17 gauge trocar needle was advanced through the right back and into the margin of the mass like pleural thickening. Multiple 18 gauge core biopsies were then obtained using the BioPince automated biopsy device. Biopsy specimens were placed in saline and sent to pathology for further analysis.  The biopsy  device and trocar needle were removed. Post biopsy axial CT imaging demonstrates no hematoma or evidence of complication. The patient tolerated the procedure well.  COMPLICATIONS: None.  IMPRESSION: Technically successful CT-guided core biopsy of masslike pleural thickening.  Signed,  Criselda Peaches, MD  [facility]  [facility]   Electronically Signed   By: Jacqulynn Cadet M.D.   On: 08/02/2014 16:46    ASSESSMENT: This is a very pleasant 68 years old white female recently diagnosed with extensive stage small cell lung cancer presenting with multiple pleural based masses in addition to large right pleural effusion and mediastinal lymphadenopathy diagnosed in October of 2015.   PLAN: I had a lengthy discussion with the patient and her family today about her current disease stage, prognosis and treatment options. I recommended for her to complete the staging workup. I ordered a PET scan as well as MRI of the brain to rule out any other metastatic disease. I discussed with the patient her treatment options including systemic chemotherapy versus palliative care and hospice referral. The patient is interested in proceeding with treatment.  I recommended for her a regimen consisting of carboplatin for AUC of 5 on day 1 and etoposide 100 mg/M2 on days 1, 2 and 3 with Neulasta support on day 4. I discussed with the patient adverse effect of the chemotherapy including but not limited to alopecia, myelosuppression, nausea and vomiting, peripheral neuropathy, liver or renal dysfunction. She is expected to start the first cycle of this treatment in early next week. The patient will have a chemotherapy education class today. I will call her pharmacy with prescription for Compazine 10 mg by mouth every 6 hours as needed for nausea. She would come back for followup visit in 2 weeks for reevaluation and management of any adverse effect of her treatment. She was  advised to call immediately if she has any concerning symptoms in the interval. I gave the patient and her family the time to ask questions and I answered them completely to their satisfaction. The patient voices understanding of current disease status and treatment options and is in agreement with the current care plan.  All questions were answered. The patient knows to call the clinic with any problems, questions or concerns. We can certainly see the patient much sooner if necessary.  Thank you so much for allowing me to participate in the care of [place]. I will continue to follow up the patient with you and assist in her care.  I spent 55 minutes counseling the patient face to face. The total time spent in the appointment was 80 minutes.  Disclaimer: This note was dictated with voice recognition software. Similar sounding words can inadvertently be transcribed and may not be corrected upon review.   Clavin Ruhlman K. 08/08/2014, 12:22 PM

## 2014-08-08 NOTE — Progress Notes (Signed)
Checked in new patient with no financial issues prior to s

## 2014-08-08 NOTE — Telephone Encounter (Signed)
Pt confirmed labs/ov/inj per 10/14 POF, sent msg to add chemo, gave pt AVS.... KJ

## 2014-08-09 ENCOUNTER — Other Ambulatory Visit: Payer: Self-pay | Admitting: *Deleted

## 2014-08-09 DIAGNOSIS — C3491 Malignant neoplasm of unspecified part of right bronchus or lung: Secondary | ICD-10-CM

## 2014-08-13 ENCOUNTER — Other Ambulatory Visit: Payer: Self-pay | Admitting: *Deleted

## 2014-08-13 ENCOUNTER — Other Ambulatory Visit (HOSPITAL_BASED_OUTPATIENT_CLINIC_OR_DEPARTMENT_OTHER): Payer: Medicare HMO

## 2014-08-13 ENCOUNTER — Ambulatory Visit (HOSPITAL_BASED_OUTPATIENT_CLINIC_OR_DEPARTMENT_OTHER): Payer: Medicare HMO

## 2014-08-13 VITALS — BP 103/48 | HR 70 | Temp 98.5°F | Resp 18

## 2014-08-13 DIAGNOSIS — C384 Malignant neoplasm of pleura: Secondary | ICD-10-CM

## 2014-08-13 DIAGNOSIS — Z5111 Encounter for antineoplastic chemotherapy: Secondary | ICD-10-CM

## 2014-08-13 DIAGNOSIS — C3491 Malignant neoplasm of unspecified part of right bronchus or lung: Secondary | ICD-10-CM

## 2014-08-13 LAB — CBC WITH DIFFERENTIAL/PLATELET
BASO%: 0.3 % (ref 0.0–2.0)
BASOS ABS: 0 10*3/uL (ref 0.0–0.1)
EOS%: 0.1 % (ref 0.0–7.0)
Eosinophils Absolute: 0 10*3/uL (ref 0.0–0.5)
HEMATOCRIT: 34.9 % (ref 34.8–46.6)
HEMOGLOBIN: 11.5 g/dL — AB (ref 11.6–15.9)
LYMPH%: 13.1 % — AB (ref 14.0–49.7)
MCH: 29.7 pg (ref 25.1–34.0)
MCHC: 33 g/dL (ref 31.5–36.0)
MCV: 90.2 fL (ref 79.5–101.0)
MONO#: 0.4 10*3/uL (ref 0.1–0.9)
MONO%: 5.9 % (ref 0.0–14.0)
NEUT#: 5.9 10*3/uL (ref 1.5–6.5)
NEUT%: 80.6 % — AB (ref 38.4–76.8)
PLATELETS: 434 10*3/uL — AB (ref 145–400)
RBC: 3.87 10*6/uL (ref 3.70–5.45)
RDW: 13.9 % (ref 11.2–14.5)
WBC: 7.3 10*3/uL (ref 3.9–10.3)
lymph#: 1 10*3/uL (ref 0.9–3.3)
nRBC: 0 % (ref 0–0)

## 2014-08-13 LAB — COMPREHENSIVE METABOLIC PANEL (CC13)
ALT: 11 U/L (ref 0–55)
ANION GAP: 10 meq/L (ref 3–11)
AST: 30 U/L (ref 5–34)
Albumin: 2.9 g/dL — ABNORMAL LOW (ref 3.5–5.0)
Alkaline Phosphatase: 86 U/L (ref 40–150)
BUN: 10.7 mg/dL (ref 7.0–26.0)
CO2: 25 meq/L (ref 22–29)
CREATININE: 0.7 mg/dL (ref 0.6–1.1)
Calcium: 10 mg/dL (ref 8.4–10.4)
Chloride: 102 mEq/L (ref 98–109)
Glucose: 121 mg/dl (ref 70–140)
Potassium: 4.1 mEq/L (ref 3.5–5.1)
SODIUM: 137 meq/L (ref 136–145)
TOTAL PROTEIN: 7.4 g/dL (ref 6.4–8.3)
Total Bilirubin: 0.72 mg/dL (ref 0.20–1.20)

## 2014-08-13 MED ORDER — SODIUM CHLORIDE 0.9 % IV SOLN
Freq: Once | INTRAVENOUS | Status: AC
  Administered 2014-08-13: 12:00:00 via INTRAVENOUS

## 2014-08-13 MED ORDER — HYDROCODONE-ACETAMINOPHEN 5-325 MG PO TABS: 1.0000 | ORAL_TABLET | Freq: Four times a day (QID) | ORAL | Status: DC | PRN

## 2014-08-13 MED ORDER — ONDANSETRON 16 MG/50ML IVPB (CHCC)
INTRAVENOUS | Status: AC
  Filled 2014-08-13: qty 16

## 2014-08-13 MED ORDER — SODIUM CHLORIDE 0.9 % IV SOLN
332.5000 mg | Freq: Once | INTRAVENOUS | Status: AC
  Administered 2014-08-13: 330 mg via INTRAVENOUS
  Filled 2014-08-13: qty 33

## 2014-08-13 MED ORDER — SODIUM CHLORIDE 0.9 % IV SOLN
100.0000 mg/m2 | Freq: Once | INTRAVENOUS | Status: AC
  Administered 2014-08-13: 150 mg via INTRAVENOUS
  Filled 2014-08-13: qty 7.5

## 2014-08-13 MED ORDER — ONDANSETRON 16 MG/50ML IVPB (CHCC)
16.0000 mg | Freq: Once | INTRAVENOUS | Status: AC
  Administered 2014-08-13: 16 mg via INTRAVENOUS

## 2014-08-13 MED ORDER — DEXAMETHASONE SODIUM PHOSPHATE 20 MG/5ML IJ SOLN
INTRAMUSCULAR | Status: AC
  Filled 2014-08-13: qty 5

## 2014-08-13 MED ORDER — OXYCODONE-ACETAMINOPHEN 5-325 MG PO TABS
ORAL_TABLET | ORAL | Status: AC
  Filled 2014-08-13: qty 1

## 2014-08-13 MED ORDER — OXYCODONE-ACETAMINOPHEN 5-325 MG PO TABS
1.0000 | ORAL_TABLET | ORAL | Status: AC
  Administered 2014-08-13: 1 via ORAL

## 2014-08-13 MED ORDER — DEXAMETHASONE SODIUM PHOSPHATE 20 MG/5ML IJ SOLN
20.0000 mg | Freq: Once | INTRAMUSCULAR | Status: AC
  Administered 2014-08-13: 20 mg via INTRAVENOUS

## 2014-08-13 MED ORDER — FENTANYL 25 MCG/HR TD PT72: 25.0000 ug | MEDICATED_PATCH | TRANSDERMAL | Status: DC

## 2014-08-13 NOTE — Patient Instructions (Addendum)
[facility] Discharge Instructions for Patients Receiving Chemotherapy  Today you received the following chemotherapy agents: Carboplatin, Etoposide  To help prevent nausea and vomiting after your treatment, we encourage you to take your nausea medication as prescribed by your physician.    If you develop nausea and vomiting that is not controlled by your nausea medication, call the clinic.   BELOW ARE SYMPTOMS THAT SHOULD BE REPORTED IMMEDIATELY:  *FEVER GREATER THAN 100.5 F  *CHILLS WITH OR WITHOUT FEVER  NAUSEA AND VOMITING THAT IS NOT CONTROLLED WITH YOUR NAUSEA MEDICATION  *UNUSUAL SHORTNESS OF BREATH  *UNUSUAL BRUISING OR BLEEDING  TENDERNESS IN MOUTH AND THROAT WITH OR WITHOUT PRESENCE OF ULCERS  *URINARY PROBLEMS  *BOWEL PROBLEMS  UNUSUAL RASH Items with * indicate a potential emergency and should be followed up as soon as possible.  Feel free to call the clinic you have any questions or concerns. The clinic phone number is [phone].  Carboplatin injection What is this medicine? CARBOPLATIN (KAR boe pla tin) is a chemotherapy drug. It targets fast dividing cells, like cancer cells, and causes these cells to die. This medicine is used to treat ovarian cancer and many other cancers. This medicine may be used for other purposes; ask your health care provider or pharmacist if you have questions. COMMON BRAND NAME(S): Paraplatin What should I tell my health care provider before I take this medicine? They need to know if you have any of these conditions: -blood disorders -hearing problems -kidney disease -recent or ongoing radiation therapy -an unusual or allergic reaction to carboplatin, cisplatin, other chemotherapy, other medicines, foods, dyes, or preservatives -pregnant or trying to get pregnant -breast-feeding How should I use this medicine? This drug is usually given as an infusion into a vein. It is administered in a hospital or clinic by a  specially trained health care professional. Talk to your pediatrician regarding the use of this medicine in children. Special care may be needed. Overdosage: If you think you have taken too much of this medicine contact a poison control center or emergency room at once. NOTE: This medicine is only for you. Do not share this medicine with others. What if I miss a dose? It is important not to miss a dose. Call your doctor or health care professional if you are unable to keep an appointment. What may interact with this medicine? -medicines for seizures -medicines to increase blood counts like filgrastim, pegfilgrastim, sargramostim -some antibiotics like amikacin, gentamicin, neomycin, streptomycin, tobramycin -vaccines Talk to your doctor or health care professional before taking any of these medicines: -acetaminophen -aspirin -ibuprofen -ketoprofen -naproxen This list may not describe all possible interactions. Give your health care provider a list of all the medicines, herbs, non-prescription drugs, or dietary supplements you use. Also tell them if you smoke, drink alcohol, or use illegal drugs. Some items may interact with your medicine. What should I watch for while using this medicine? Your condition will be monitored carefully while you are receiving this medicine. You will need important blood work done while you are taking this medicine. This drug may make you feel generally unwell. This is not uncommon, as chemotherapy can affect healthy cells as well as cancer cells. Report any side effects. Continue your course of treatment even though you feel ill unless your doctor tells you to stop. In some cases, you may be given additional medicines to help with side effects. Follow all directions for their use. Call your doctor or health care professional for  advice if you get a fever, chills or sore throat, or other symptoms of a cold or flu. Do not treat yourself. This drug decreases your  body's ability to fight infections. Try to avoid being around people who are sick. This medicine may increase your risk to bruise or bleed. Call your doctor or health care professional if you notice any unusual bleeding. Be careful brushing and flossing your teeth or using a toothpick because you may get an infection or bleed more easily. If you have any dental work done, tell your dentist you are receiving this medicine. Avoid taking products that contain aspirin, acetaminophen, ibuprofen, naproxen, or ketoprofen unless instructed by your doctor. These medicines may hide a fever. Do not become pregnant while taking this medicine. Women should inform their doctor if they wish to become pregnant or think they might be pregnant. There is a potential for serious side effects to an unborn child. Talk to your health care professional or pharmacist for more information. Do not breast-feed an infant while taking this medicine. What side effects may I notice from receiving this medicine? Side effects that you should report to your doctor or health care professional as soon as possible: -allergic reactions like skin rash, itching or hives, swelling of the face, lips, or tongue -signs of infection - fever or chills, cough, sore throat, pain or difficulty passing urine -signs of decreased platelets or bleeding - bruising, pinpoint red spots on the skin, black, tarry stools, nosebleeds -signs of decreased red blood cells - unusually weak or tired, fainting spells, lightheadedness -breathing problems -changes in hearing -changes in vision -chest pain -high blood pressure -low blood counts - This drug may decrease the number of white blood cells, red blood cells and platelets. You may be at increased risk for infections and bleeding. -nausea and vomiting -pain, swelling, redness or irritation at the injection site -pain, tingling, numbness in the hands or feet -problems with balance, talking, walking -trouble  passing urine or change in the amount of urine Side effects that usually do not require medical attention (report to your doctor or health care professional if they continue or are bothersome): -hair loss -loss of appetite -metallic taste in the mouth or changes in taste This list may not describe all possible side effects. Call your doctor for medical advice about side effects. You may report side effects to FDA at 1-800-FDA-1088. Where should I keep my medicine? This drug is given in a hospital or clinic and will not be stored at home. NOTE: This sheet is a summary. It may not cover all possible information. If you have questions about this medicine, talk to your doctor, pharmacist, or health care provider.  2015, [employer] Standard. (2008-01-17 14:38:05)  Etoposide, VP-16 injection What is this medicine? ETOPOSIDE, VP-16 (e toe POE side) is a chemotherapy drug. It is used to treat testicular cancer, lung cancer, and other cancers. This medicine may be used for other purposes; ask your health care provider or pharmacist if you have questions. COMMON BRAND NAME(S): Etopophos, Toposar, VePesid What should I tell my health care provider before I take this medicine? They need to know if you have any of these conditions: -infection -kidney disease -low blood counts, like low white cell, platelet, or red cell counts -an unusual or allergic reaction to etoposide, other chemotherapeutic agents, other medicines, foods, dyes, or preservatives -pregnant or trying to get pregnant -breast-feeding How should I use this medicine? This medicine is for infusion into a vein. It is  administered in a hospital or clinic by a specially trained health care professional. Talk to your pediatrician regarding the use of this medicine in children. Special care may be needed. Overdosage: If you think you have taken too much of this medicine contact a poison control center or emergency room at once. NOTE: This  medicine is only for you. Do not share this medicine with others. What if I miss a dose? It is important not to miss your dose. Call your doctor or health care professional if you are unable to keep an appointment. What may interact with this medicine? -cyclosporine -medicines to increase blood counts like filgrastim, pegfilgrastim, sargramostim -vaccines This list may not describe all possible interactions. Give your health care provider a list of all the medicines, herbs, non-prescription drugs, or dietary supplements you use. Also tell them if you smoke, drink alcohol, or use illegal drugs. Some items may interact with your medicine. What should I watch for while using this medicine? Visit your doctor for checks on your progress. This drug may make you feel generally unwell. This is not uncommon, as chemotherapy can affect healthy cells as well as cancer cells. Report any side effects. Continue your course of treatment even though you feel ill unless your doctor tells you to stop. In some cases, you may be given additional medicines to help with side effects. Follow all directions for their use. Call your doctor or health care professional for advice if you get a fever, chills or sore throat, or other symptoms of a cold or flu. Do not treat yourself. This drug decreases your body's ability to fight infections. Try to avoid being around people who are sick. This medicine may increase your risk to bruise or bleed. Call your doctor or health care professional if you notice any unusual bleeding. Be careful brushing and flossing your teeth or using a toothpick because you may get an infection or bleed more easily. If you have any dental work done, tell your dentist you are receiving this medicine. Avoid taking products that contain aspirin, acetaminophen, ibuprofen, naproxen, or ketoprofen unless instructed by your doctor. These medicines may hide a fever. Do not become pregnant while taking this  medicine. Women should inform their doctor if they wish to become pregnant or think they might be pregnant. There is a potential for serious side effects to an unborn child. Talk to your health care professional or pharmacist for more information. Do not breast-feed an infant while taking this medicine. What side effects may I notice from receiving this medicine? Side effects that you should report to your doctor or health care professional as soon as possible: -allergic reactions like skin rash, itching or hives, swelling of the face, lips, or tongue -low blood counts - this medicine may decrease the number of white blood cells, red blood cells and platelets. You may be at increased risk for infections and bleeding. -signs of infection - fever or chills, cough, sore throat, pain or difficulty passing urine -signs of decreased platelets or bleeding - bruising, pinpoint red spots on the skin, black, tarry stools, blood in the urine -signs of decreased red blood cells - unusually weak or tired, fainting spells, lightheadedness -breathing problems -changes in vision -mouth or throat sores or ulcers -pain, redness, swelling or irritation at the injection site -pain, tingling, numbness in the hands or feet -redness, blistering, peeling or loosening of the skin, including inside the mouth -seizures -vomiting Side effects that usually do not require medical attention (report  to your doctor or health care professional if they continue or are bothersome): -diarrhea -hair loss -loss of appetite -nausea -stomach pain This list may not describe all possible side effects. Call your doctor for medical advice about side effects. You may report side effects to FDA at 1-800-FDA-1088. Where should I keep my medicine? This drug is given in a hospital or clinic and will not be stored at home. NOTE: This sheet is a summary. It may not cover all possible information. If you have questions about this medicine,  talk to your doctor, pharmacist, or health care provider.  2015, [employer] Standard. (2008-02-13 17:24:12)

## 2014-08-13 NOTE — Progress Notes (Deleted)
Patient tolerated first tx of VP-16 and carbo well today. AVS, appts, med teaching done with son.

## 2014-08-13 NOTE — Progress Notes (Signed)
Pt requests refill for fentanyl patch and norco. Last dose of  norco was 2 days ago.

## 2014-08-13 NOTE — Progress Notes (Signed)
Patient tolerated 1st tx of KLKJZ/PH-15 without complications. AVS, appts reviewed with patient and son, as well as importance of staying hydrated, s/s to report. Both voice understanding of teaching. No questions at discharge. Discharged ambulatory in no acute distress, along with script for norco and fentanyl patch.

## 2014-08-14 ENCOUNTER — Ambulatory Visit (HOSPITAL_BASED_OUTPATIENT_CLINIC_OR_DEPARTMENT_OTHER): Payer: Medicare HMO

## 2014-08-14 VITALS — BP 103/41 | HR 64 | Temp 98.5°F | Resp 18

## 2014-08-14 DIAGNOSIS — C3491 Malignant neoplasm of unspecified part of right bronchus or lung: Secondary | ICD-10-CM

## 2014-08-14 DIAGNOSIS — Z5111 Encounter for antineoplastic chemotherapy: Secondary | ICD-10-CM

## 2014-08-14 DIAGNOSIS — C384 Malignant neoplasm of pleura: Secondary | ICD-10-CM

## 2014-08-14 MED ORDER — PROCHLORPERAZINE MALEATE 10 MG PO TABS
10.0000 mg | ORAL_TABLET | Freq: Once | ORAL | Status: AC
  Administered 2014-08-14: 10 mg via ORAL

## 2014-08-14 MED ORDER — SODIUM CHLORIDE 0.9 % IV SOLN
Freq: Once | INTRAVENOUS | Status: AC
  Administered 2014-08-14: 15:00:00 via INTRAVENOUS

## 2014-08-14 MED ORDER — SODIUM CHLORIDE 0.9 % IV SOLN
100.0000 mg/m2 | Freq: Once | INTRAVENOUS | Status: AC
  Administered 2014-08-14: 150 mg via INTRAVENOUS
  Filled 2014-08-14: qty 7.5

## 2014-08-14 MED ORDER — PROCHLORPERAZINE MALEATE 10 MG PO TABS
ORAL_TABLET | ORAL | Status: AC
  Filled 2014-08-14: qty 1

## 2014-08-14 NOTE — Patient Instructions (Signed)
[facility] Discharge Instructions for Patients Receiving Chemotherapy  Today you received the following chemotherapy agent VP-16  To help prevent nausea and vomiting after your treatment, we encourage you to take your nausea medication.   If you develop nausea and vomiting that is not controlled by your nausea medication, call the clinic.   BELOW ARE SYMPTOMS THAT SHOULD BE REPORTED IMMEDIATELY:  *FEVER GREATER THAN 100.5 F  *CHILLS WITH OR WITHOUT FEVER  NAUSEA AND VOMITING THAT IS NOT CONTROLLED WITH YOUR NAUSEA MEDICATION  *UNUSUAL SHORTNESS OF BREATH  *UNUSUAL BRUISING OR BLEEDING  TENDERNESS IN MOUTH AND THROAT WITH OR WITHOUT PRESENCE OF ULCERS  *URINARY PROBLEMS  *BOWEL PROBLEMS  UNUSUAL RASH Items with * indicate a potential emergency and should be followed up as soon as possible.  Feel free to call the clinic you have any questions or concerns. The clinic phone number is [phone].

## 2014-08-15 ENCOUNTER — Ambulatory Visit (HOSPITAL_BASED_OUTPATIENT_CLINIC_OR_DEPARTMENT_OTHER): Payer: Medicare HMO

## 2014-08-15 ENCOUNTER — Telehealth: Payer: Self-pay | Admitting: Medical Oncology

## 2014-08-15 VITALS — BP 95/46 | HR 65 | Temp 98.3°F | Resp 18

## 2014-08-15 DIAGNOSIS — Z5111 Encounter for antineoplastic chemotherapy: Secondary | ICD-10-CM

## 2014-08-15 DIAGNOSIS — C384 Malignant neoplasm of pleura: Secondary | ICD-10-CM

## 2014-08-15 DIAGNOSIS — C3491 Malignant neoplasm of unspecified part of right bronchus or lung: Secondary | ICD-10-CM

## 2014-08-15 MED ORDER — PROCHLORPERAZINE MALEATE 10 MG PO TABS
10.0000 mg | ORAL_TABLET | Freq: Once | ORAL | Status: AC
  Administered 2014-08-15: 10 mg via ORAL

## 2014-08-15 MED ORDER — PROCHLORPERAZINE MALEATE 10 MG PO TABS
ORAL_TABLET | ORAL | Status: AC
  Filled 2014-08-15: qty 1

## 2014-08-15 MED ORDER — SODIUM CHLORIDE 0.9 % IV SOLN
Freq: Once | INTRAVENOUS | Status: AC
  Administered 2014-08-15: 14:00:00 via INTRAVENOUS

## 2014-08-15 MED ORDER — SODIUM CHLORIDE 0.9 % IV SOLN
100.0000 mg/m2 | Freq: Once | INTRAVENOUS | Status: AC
  Administered 2014-08-15: 150 mg via INTRAVENOUS
  Filled 2014-08-15: qty 7.5

## 2014-08-15 NOTE — Progress Notes (Signed)
Pt stated that she tolerated first dose of chemo very well. She denied any problems

## 2014-08-15 NOTE — Telephone Encounter (Signed)
Pt stated she tolerated first day of chemo very well. She denied any problems or symptoms.

## 2014-08-15 NOTE — Patient Instructions (Signed)
[facility] Discharge Instructions for Patients Receiving Chemotherapy  Today you received the following chemotherapy agents Etoposide.  To help prevent nausea and vomiting after your treatment, we encourage you to take your nausea medication as directed.   If you develop nausea and vomiting that is not controlled by your nausea medication, call the clinic.   BELOW ARE SYMPTOMS THAT SHOULD BE REPORTED IMMEDIATELY:  *FEVER GREATER THAN 100.5 F  *CHILLS WITH OR WITHOUT FEVER  NAUSEA AND VOMITING THAT IS NOT CONTROLLED WITH YOUR NAUSEA MEDICATION  *UNUSUAL SHORTNESS OF BREATH  *UNUSUAL BRUISING OR BLEEDING  TENDERNESS IN MOUTH AND THROAT WITH OR WITHOUT PRESENCE OF ULCERS  *URINARY PROBLEMS  *BOWEL PROBLEMS  UNUSUAL RASH Items with * indicate a potential emergency and should be followed up as soon as possible.  Feel free to call the clinic you have any questions or concerns. The clinic phone number is [phone].

## 2014-08-16 ENCOUNTER — Ambulatory Visit (HOSPITAL_BASED_OUTPATIENT_CLINIC_OR_DEPARTMENT_OTHER): Payer: Medicare HMO

## 2014-08-16 ENCOUNTER — Encounter (HOSPITAL_COMMUNITY): Payer: Self-pay

## 2014-08-16 ENCOUNTER — Telehealth: Payer: Self-pay | Admitting: Internal Medicine

## 2014-08-16 ENCOUNTER — Other Ambulatory Visit: Payer: Self-pay | Admitting: Internal Medicine

## 2014-08-16 ENCOUNTER — Telehealth: Payer: Self-pay | Admitting: *Deleted

## 2014-08-16 ENCOUNTER — Other Ambulatory Visit: Payer: Self-pay | Admitting: Emergency Medicine

## 2014-08-16 ENCOUNTER — Ambulatory Visit (HOSPITAL_COMMUNITY)
Admission: RE | Admit: 2014-08-16 | Discharge: 2014-08-16 | Disposition: A | Discharge status: Home / Self Care | Payer: Medicare HMO | Source: Ambulatory Visit | Attending: Internal Medicine | Admitting: Internal Medicine

## 2014-08-16 ENCOUNTER — Encounter (HOSPITAL_COMMUNITY)
Admission: RE | Admit: 2014-08-16 | Discharge: 2014-08-16 | Disposition: A | Discharge status: Home / Self Care | Payer: Medicare HMO | Source: Ambulatory Visit | Attending: Internal Medicine | Admitting: Internal Medicine

## 2014-08-16 VITALS — BP 106/64 | HR 88 | Temp 98.4°F | Resp 18

## 2014-08-16 DIAGNOSIS — C3491 Malignant neoplasm of unspecified part of right bronchus or lung: Secondary | ICD-10-CM | POA: Insufficient documentation

## 2014-08-16 DIAGNOSIS — G9389 Other specified disorders of brain: Secondary | ICD-10-CM | POA: Insufficient documentation

## 2014-08-16 DIAGNOSIS — C3490 Malignant neoplasm of unspecified part of unspecified bronchus or lung: Secondary | ICD-10-CM | POA: Diagnosis present

## 2014-08-16 DIAGNOSIS — C384 Malignant neoplasm of pleura: Secondary | ICD-10-CM

## 2014-08-16 DIAGNOSIS — Z5189 Encounter for other specified aftercare: Secondary | ICD-10-CM

## 2014-08-16 LAB — GLUCOSE, CAPILLARY: Glucose-Capillary: 106 mg/dL — ABNORMAL HIGH (ref 70–99)

## 2014-08-16 MED ORDER — PEGFILGRASTIM INJECTION 6 MG/0.6ML
6.0000 mg | Freq: Once | SUBCUTANEOUS | Status: AC
  Administered 2014-08-16: 6 mg via SUBCUTANEOUS
  Filled 2014-08-16: qty 0.6

## 2014-08-16 MED ORDER — FLUDEOXYGLUCOSE F - 18 (FDG) INJECTION
5.9000 | Freq: Once | INTRAVENOUS | Status: AC | PRN
  Administered 2014-08-16: 5.9 via INTRAVENOUS

## 2014-08-16 NOTE — Telephone Encounter (Signed)
Per staff message and POF I have scheduled appts. Patietn will pcik up schedule at next appt JMW

## 2014-08-16 NOTE — Telephone Encounter (Signed)
pt daughter call to r/s appt...done.[place]

## 2014-08-17 ENCOUNTER — Other Ambulatory Visit: Payer: Self-pay | Admitting: *Deleted

## 2014-08-17 ENCOUNTER — Telehealth: Payer: Self-pay | Admitting: *Deleted

## 2014-08-17 ENCOUNTER — Encounter: Payer: Self-pay | Admitting: *Deleted

## 2014-08-17 DIAGNOSIS — C3491 Malignant neoplasm of unspecified part of right bronchus or lung: Secondary | ICD-10-CM

## 2014-08-17 NOTE — Telephone Encounter (Signed)
Called pt at home for post chemo follow up.  Spoke with son [name].  Per [name], pt was doing ok.  Son denied pt having any nausea/vomiting,  Eating some, and drinking fluids as tolerated.  Denied pain, bladder functions fine.  Per [name], pt has problem with constipation - going on for 2-3 weeks now.  Son stated pt had informed her physicians about this problem.  [name] stated pt did not complain of feeling bloated, no complain of abdominal pain.   Instructed Eric to give pt Magnesium citrate - half bottle now, give pt prunes, and continue to drink lots of water.  Wait for 2 hours and then repeat if no results.  Instructed Eric to give pt stool softener as needed.  [name] understood to take pt to ER for further evaluation if pt complained of abdominal cramping, and/or feeling bloated.  Eric voiced understanding.

## 2014-08-17 NOTE — Telephone Encounter (Signed)
Spoke with Adrena, PA about pt's constipation problem.  PA agreed with nurse's suggestions of pt taking magnesium citrate and for pt to go to ER with any abdominal discomfort issues.

## 2014-08-17 NOTE — [facility] Oncology Navigator Note (Unsigned)
Triage RN spoke to be regarding patient status.  She stated patient was constipated and not eating well.  I made referral to dietitian.

## 2014-08-20 ENCOUNTER — Telehealth: Payer: Self-pay | Admitting: Medical Oncology

## 2014-08-20 ENCOUNTER — Ambulatory Visit (HOSPITAL_BASED_OUTPATIENT_CLINIC_OR_DEPARTMENT_OTHER): Payer: Medicare HMO

## 2014-08-20 ENCOUNTER — Other Ambulatory Visit: Payer: Medicare HMO

## 2014-08-20 DIAGNOSIS — C3491 Malignant neoplasm of unspecified part of right bronchus or lung: Secondary | ICD-10-CM

## 2014-08-20 DIAGNOSIS — C384 Malignant neoplasm of pleura: Secondary | ICD-10-CM

## 2014-08-20 LAB — CBC WITH DIFFERENTIAL/PLATELET
BASO%: 0.3 % (ref 0.0–2.0)
Basophils Absolute: 0 10*3/uL (ref 0.0–0.1)
EOS ABS: 0 10*3/uL (ref 0.0–0.5)
EOS%: 0.9 % (ref 0.0–7.0)
HCT: 28.8 % — ABNORMAL LOW (ref 34.8–46.6)
HGB: 9.4 g/dL — ABNORMAL LOW (ref 11.6–15.9)
LYMPH#: 1.3 10*3/uL (ref 0.9–3.3)
LYMPH%: 38.6 % (ref 14.0–49.7)
MCH: 29.4 pg (ref 25.1–34.0)
MCHC: 32.6 g/dL (ref 31.5–36.0)
MCV: 90 fL (ref 79.5–101.0)
MONO#: 0.1 10*3/uL (ref 0.1–0.9)
MONO%: 2.3 % (ref 0.0–14.0)
NEUT%: 57.9 % (ref 38.4–76.8)
NEUTROS ABS: 2 10*3/uL (ref 1.5–6.5)
Platelets: 186 10*3/uL (ref 145–400)
RBC: 3.2 10*6/uL — AB (ref 3.70–5.45)
RDW: 13.7 % (ref 11.2–14.5)
WBC: 3.4 10*3/uL — AB (ref 3.9–10.3)
nRBC: 0 % (ref 0–0)

## 2014-08-20 LAB — COMPREHENSIVE METABOLIC PANEL (CC13)
ALT: 8 U/L (ref 0–55)
ANION GAP: 11 meq/L (ref 3–11)
AST: 19 U/L (ref 5–34)
Albumin: 3 g/dL — ABNORMAL LOW (ref 3.5–5.0)
Alkaline Phosphatase: 104 U/L (ref 40–150)
BUN: 9.5 mg/dL (ref 7.0–26.0)
CHLORIDE: 102 meq/L (ref 98–109)
CO2: 26 meq/L (ref 22–29)
Calcium: 9.8 mg/dL (ref 8.4–10.4)
Creatinine: 0.7 mg/dL (ref 0.6–1.1)
Glucose: 105 mg/dl (ref 70–140)
Potassium: 5 mEq/L (ref 3.5–5.1)
SODIUM: 138 meq/L (ref 136–145)
TOTAL PROTEIN: 7.3 g/dL (ref 6.4–8.3)
Total Bilirubin: 0.78 mg/dL (ref 0.20–1.20)

## 2014-08-20 NOTE — Telephone Encounter (Signed)
Daughter called and said pt does not want to continue with chemo . Dr Julien Nordmann notified.

## 2014-08-27 ENCOUNTER — Ambulatory Visit: Payer: Medicare HMO | Admitting: Adult Health

## 2014-08-27 ENCOUNTER — Other Ambulatory Visit: Payer: Medicare HMO

## 2014-08-30 ENCOUNTER — Other Ambulatory Visit (HOSPITAL_BASED_OUTPATIENT_CLINIC_OR_DEPARTMENT_OTHER): Payer: Commercial Managed Care - HMO

## 2014-08-30 ENCOUNTER — Encounter: Payer: Self-pay | Admitting: Adult Health

## 2014-08-30 ENCOUNTER — Ambulatory Visit (HOSPITAL_BASED_OUTPATIENT_CLINIC_OR_DEPARTMENT_OTHER): Payer: Commercial Managed Care - HMO | Admitting: Adult Health

## 2014-08-30 VITALS — BP 101/42 | HR 55 | Temp 98.4°F | Resp 16 | Ht 65.0 in | Wt 102.4 lb

## 2014-08-30 DIAGNOSIS — C3482 Malignant neoplasm of overlapping sites of left bronchus and lung: Secondary | ICD-10-CM

## 2014-08-30 DIAGNOSIS — C3491 Malignant neoplasm of unspecified part of right bronchus or lung: Secondary | ICD-10-CM

## 2014-08-30 DIAGNOSIS — R634 Abnormal weight loss: Secondary | ICD-10-CM

## 2014-08-30 DIAGNOSIS — R0602 Shortness of breath: Secondary | ICD-10-CM

## 2014-08-30 DIAGNOSIS — C3481 Malignant neoplasm of overlapping sites of right bronchus and lung: Secondary | ICD-10-CM

## 2014-08-30 LAB — CBC WITH DIFFERENTIAL/PLATELET
BASO%: 0.1 % (ref 0.0–2.0)
Basophils Absolute: 0 10*3/uL (ref 0.0–0.1)
EOS%: 0.1 % (ref 0.0–7.0)
Eosinophils Absolute: 0 10*3/uL (ref 0.0–0.5)
HCT: 28.7 % — ABNORMAL LOW (ref 34.8–46.6)
HGB: 9.2 g/dL — ABNORMAL LOW (ref 11.6–15.9)
LYMPH%: 15.6 % (ref 14.0–49.7)
MCH: 29.3 pg (ref 25.1–34.0)
MCHC: 32.1 g/dL (ref 31.5–36.0)
MCV: 91.4 fL (ref 79.5–101.0)
MONO#: 0.7 10*3/uL (ref 0.1–0.9)
MONO%: 5.4 % (ref 0.0–14.0)
NEUT#: 10.6 10*3/uL — ABNORMAL HIGH (ref 1.5–6.5)
NEUT%: 78.8 % — AB (ref 38.4–76.8)
Platelets: 347 10*3/uL (ref 145–400)
RBC: 3.14 10*6/uL — ABNORMAL LOW (ref 3.70–5.45)
RDW: 14.4 % (ref 11.2–14.5)
WBC: 13.4 10*3/uL — ABNORMAL HIGH (ref 3.9–10.3)
lymph#: 2.1 10*3/uL (ref 0.9–3.3)

## 2014-08-30 LAB — COMPREHENSIVE METABOLIC PANEL (CC13)
ALK PHOS: 124 U/L (ref 40–150)
ALT: 8 U/L (ref 0–55)
AST: 11 U/L (ref 5–34)
Albumin: 3 g/dL — ABNORMAL LOW (ref 3.5–5.0)
Anion Gap: 8 mEq/L (ref 3–11)
BILIRUBIN TOTAL: 0.23 mg/dL (ref 0.20–1.20)
BUN: 5.9 mg/dL — ABNORMAL LOW (ref 7.0–26.0)
CO2: 29 mEq/L (ref 22–29)
CREATININE: 0.8 mg/dL (ref 0.6–1.1)
Calcium: 9.6 mg/dL (ref 8.4–10.4)
Chloride: 100 mEq/L (ref 98–109)
Glucose: 159 mg/dl — ABNORMAL HIGH (ref 70–140)
Potassium: 4.2 mEq/L (ref 3.5–5.1)
Sodium: 138 mEq/L (ref 136–145)
Total Protein: 7.2 g/dL (ref 6.4–8.3)

## 2014-08-31 ENCOUNTER — Encounter: Payer: Self-pay | Admitting: *Deleted

## 2014-08-31 ENCOUNTER — Telehealth: Payer: Self-pay | Admitting: *Deleted

## 2014-08-31 NOTE — Progress Notes (Signed)
[place] Telephone:([phone]) [phone]   Fax:(336) [phone]  CONSULT NOTE  REFERRING PHYSICIAN: Dr. Marzetta Board  DIAGNOSIS:  68 years old white female recently diagnosed with lung cancer.  ONCOLOGIC HISTORY:   KATHRINA CROSLEY is a 68 y.o. female was past medical history significant for severe depression as well as history of spinal stenosis, degenerative joint disease, dyslipidemia, atrial fibrillation, history of skin cancer and malnutrition. The patient was committed to a mental health facility because of her severe depression. She also has been complaining of increasing shortness of breath and significant weight loss of 54 pounds over the last 3-4 months. She had chest x-ray that showed abnormality in the right lung. She was admitted to [facility] for further evaluation and CT scan of the chest was performed on 08/02/2014. It showed moderate right pleural effusion with extensive nodular pleural thickening and confluent masses measuring up to 3.5 CM. There is mediastinal adenopathy, precarinal node measured up to 2.0 CM short axis. There was confluent right pericardial lymph node mass up to 2.3 CM in short axis diameter. There was also 2 small nodules in the right middle lobe measuring approximately 3 mm and extensive atelectasis throughout much of the right lower lobe limiting parenchymal evaluation. The patient also had mild compression fracture deformities at T10 and superior endplate of L1 that were present since 10/27/2013. On 08/02/2014 the patient underwent CT-guided biopsy of the masslike thickening of the right pleura by interventional radiology. The final pathology (Accession: [id number]) was consistent with a small cell carcinoma. There are high grade malignant cells with very high N/C ratio, inconspicuous nucleoli and scanty cytoplasm. Immunostains were performed and the malignant cells are positive for CK-7, TTF-1, synaptophysin, p63 and patchy positive  for p63, negative for CK5/6, CK20, CDX-2 and calretinin with appropriate controls. 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 She received Carboplatin and Etoposide x 1 cycle on 08/13/14.  INTERVAL HISTORY:  Atara is here today for follow up after receiving her first cycle of Carboplatin and Etoposide.  She met with Loren Racer today because she was interested about filling out advanced directives.  She is accompanied by her daughter [name].  Her son [name], lives in [place] and is home currently taking care of some things and is returning this weekend.  The daughter and the patient request the results of the scans.  The patient is very clear that she does not want any more chemotherapy.  She says that she is tired of dealing with it.  She would like a referral to hospice.  Her preference is to live at home.  She currently does live at home, but her son [name] will be returning this weekend to help take care of her.  She does have occasional back pain that she says is chornic and she is on Fentanyl and Norco which is managing her pain.  She is constipated, and takes Senokot S and has bowel movements daily.  She says that she is fatigued a lot.   HPI  Past Medical History  Diagnosis Date  . DJD (degenerative joint disease)   . Insomnia   . Hyperlipidemia   . Heart murmur     per MD note - pt not aware of any murmur  . Dysrhythmia     Hx Afib per MD note - pt unaware of any hx of a fib  . History of skin cancer     Past Surgical History  Procedure Laterality Date  . Appendectomy    . Tonsillectomy    . Lumbar laminectomy/decompression microdiscectomy N/A 11/01/2013    Procedure:  MICRODISCECTOMY LUMBAR DECOMPRESSION L4-L5   ( 1 LEVEL);  Surgeon: Johnn Hai, MD;  Location: [facility];  Service: Orthopedics;  Laterality: N/A;    History reviewed. No pertinent family history.  Social History History  Substance Use Topics  . Smoking status:  Former Smoker -- 0.50 packs/day for 50 years    Quit date: 07/23/2014  . Smokeless tobacco: Never Used  . Alcohol Use: No    No Known Allergies  Current Outpatient Prescriptions  Medication Sig Dispense Refill  . DULoxetine (CYMBALTA) 30 MG capsule Take 1 capsule (30 mg total) by mouth at bedtime. 30 capsule 1  . fentaNYL (DURAGESIC - DOSED MCG/HR) 25 MCG/HR patch Place 1 patch (25 mcg total) onto the skin every 3 (three) days. 10 patch 0  . HYDROcodone-acetaminophen (NORCO/VICODIN) 5-325 MG per tablet Take 1 tablet by mouth every 6 (six) hours as needed for moderate pain. 30 tablet 0  . nicotine (NICODERM CQ - DOSED IN MG/24 HOURS) 14 mg/24hr patch Place 1 patch (14 mg total) onto the skin daily. 28 patch 0  . prochlorperazine (COMPAZINE) 10 MG tablet Take 1 tablet (10 mg total) by mouth every 6 (six) hours as needed for nausea or vomiting. 30 tablet 0  . traZODone (DESYREL) 100 MG tablet Take 1 tablet (100 mg total) by mouth at bedtime. 30 tablet 1   No current facility-administered medications for this visit.    Review of Systems A 10 point review of systems was conducted and is otherwise negative except for what is noted above.     Physical Exam BP 101/42 mmHg  Pulse 55  Temp(Src) 98.4 F (36.9 C) (Oral)  Resp 16  Ht 5' 5"  (1.651 m)  Wt 102 lb 6.4 oz (46.448 kg)  BMI 17.04 kg/m2 GENERAL: Patient is a chronically ill appearing female, appears older than stated age, sitting in a wheelchair.  HEENT:  Sclerae anicteric.  Oropharynx clear and moist. No ulcerations or evidence of oropharyngeal candidiasis. Neck is supple.  NODES:  No cervical, supraclavicular, or axillary lymphadenopathy palpated.  LUNGS:  diminished bilaterally.  No wheezes or rhonchi. HEART:  Regular rate and rhythm. No murmur appreciated. ABDOMEN:  Soft, nontender.  Positive, normoactive bowel sounds. No organomegaly palpated. MSK:  No focal spinal tenderness to palpation. Full range of motion bilaterally in  the upper extremities. EXTREMITIES:  No peripheral edema.   SKIN:  Clear with no obvious rashes or skin changes. No nail dyscrasia. NEURO:  Nonfocal. Well oriented.  Appropriate affect. PERFORMANCE STATUS: ECOG 2-3  LABORATORY DATA: Lab Results  Component Value Date   WBC 13.4* 08/30/2014   HGB 9.2* 08/30/2014   HCT 28.7* 08/30/2014   MCV 91.4 08/30/2014   PLT 347 08/30/2014      Chemistry      Component Value Date/Time   NA 138 08/30/2014 1455   NA 138 08/03/2014 0500   K 4.2 08/30/2014 1455   K 4.2 08/03/2014 0500   CL 102 08/03/2014 0500   CO2 29 08/30/2014 1455   CO2 25 08/03/2014 0500   BUN 5.9* 08/30/2014 1455   BUN 10 08/03/2014 0500   CREATININE 0.8 08/30/2014 1455   CREATININE 0.63 08/03/2014 0500      Component Value Date/Time   CALCIUM 9.6 08/30/2014 1455   CALCIUM 8.7 08/03/2014 0500   ALKPHOS 124 08/30/2014 1455  ALKPHOS 63 08/03/2014 0500   AST 11 08/30/2014 1455   AST 17 08/03/2014 0500   ALT 8 08/30/2014 1455   ALT 6 08/03/2014 0500   BILITOT 0.23 08/30/2014 1455   BILITOT 0.3 08/03/2014 0500       RADIOGRAPHIC STUDIES:  CLINICAL DATA: Small cell lung cancer.  EXAM: MRI HEAD WITHOUT CONTRAST  TECHNIQUE: Multiplanar, multiecho pulse sequences of the brain and surrounding structures were obtained without intravenous contrast.  COMPARISON: None.  FINDINGS: The patient refused intravenous contrast however based on the findings I strongly suggest the patient come back for postcontrast imaging.  Left parietal ependymal nodule involving of the lateral ventricle projecting into the left lateral ventricle. This measures 5 mm and shows hyperintensity on diffusion-weighted imaging and restricted diffusion. Therefore this does not appear to represent gray matter heterotopia and is most likely metastatic disease. No other lesions identified on unenhanced images.  Ventricle size is normal. Negative for hemorrhage. No edema or shift of  the midline structures  Paranasal sinuses are clear.  IMPRESSION: 5 mm ependymal nodule left lateral ventricle, with restricted diffusion. This is suspicious for metastatic disease however should be confirmed with postcontrast imaging. The patient refused contrast on today's study but hopefully will return for this at a later time. No other lesions are identified.   Electronically Signed  By: Franchot Gallo M.D.  On: 08/16/2014 16:36       CLINICAL DATA: Initial treatment strategy for small cell lung cancer.  EXAM: NUCLEAR MEDICINE PET SKULL BASE TO THIGH  TECHNIQUE: 5.9 mCi F-18 FDG was injected intravenously. Full-ring PET imaging was performed from the skull base to thigh after the radiotracer. CT data was obtained and used for attenuation correction and anatomic localization.  FASTING BLOOD GLUCOSE: Value: 106 mg/dl  COMPARISON: None  FINDINGS: NECK  No hypermetabolic lymph nodes in the neck.  CHEST  There is a large partially loculated right pleural effusion. Significant volume loss from the right lung is identified due to the effusion There is extensive trans pleural spread of tumor throughout the right hemi thorax. Tumor at the right lung base measures 4.4 x 3.1 cm and has an SUV max equal to 6.1. Paramediastinal pleural mass within the right lower hemi thorax measures 2.5 by 4.9 cm and has an SUV max equal to 5.1. Anterior medial pleural nodule within the upper right hemi thorax measures 1.6 x 1.5 cm and has an SUV max equal to 6.3.  No hypermetabolic pulmonary nodules identified within the left lung. Hypermetabolic right paratracheal lymph node measures 1.6 cm and has an SUV max equal to 6.2.  ABDOMEN/PELVIS  No abnormal hypermetabolic activity within the liver, pancreas, adrenal glands, or spleen. No hypermetabolic lymph nodes in the abdomen or pelvis.  SKELETON  No focal hypermetabolic activity to suggest skeletal  metastasis.  IMPRESSION: 1. In this patient with biopsy-proven small cell lung cancer there is trans pleural spread of tumor involving the right hemi thorax. There is also hypermetabolic adenopathy within the right paratracheal region. Findings compatible with extensive stage. 2. No evidence for distant metastatic disease.   Electronically Signed  By: Kerby Moors M.D.  On: 08/16/2014 17:00    ASSESSMENT: 68 year old woman recently diagnosed with extensive stage small cell lung cancer presenting with multiple pleural based masses in addition to large right pleural effusion and mediastinal lymphadenopathy diagnosed in October of 2015.   PLAN:  I talked to the patient and her daughter in detail about her plan, about hospice, and I  reviewed the scans with them in detail.  She does not want to be resuscitated if her heart stops or if she stops breathing, for any reason.  We discussed the fact that the lung cancer will ultimately take her life.  She understands and is aware of this.  I reviewed hospice and their services, and requested Colletta Maryland, RN Dr. Worthy Flank nurse today refer the patient to hospice.    The above plan was reviewed in extensive detail with the patient and her daughter and they are both in agreement with the plan.  I offered for them to return and discuss with Dr. Julien Nordmann as he is out of the office this afternoon and they declined.  The patient requested all her future appointments with the cancer center be discontinued.    All questions were answered. The patient knows to call the clinic with any problems, questions or concerns. We can certainly see the patient again if needed.     I spent 25 minutes counseling the patient face to face. The total time spent in the appointment was 30 minutes.  Minette Headland, Isabel [phone]  08/31/2014, 11:00 AM

## 2014-08-31 NOTE — Telephone Encounter (Signed)
Pt saw Chloe Snyder yesterday, she is requesting a hospice consult.  Hospice called in for [name].  Pt's daughter will be contacted per pt request.  Will forward to Dr Vista Mink.

## 2014-08-31 NOTE — Progress Notes (Signed)
[place] Work  Clinical Social Work was referred by patient and daughter for assessment of psychosocial needs, request for ADRs to be completed at appointment on 08/30/14.  Clinical Social Worker met with patient and daughter briefly prior to their appointment at [facility] to offer support, explain ADR process and assess for needs.  CSW explained no volunteers available to witness currently and that appointments are usually made in advance. Pt and daughter stated understanding. CSW followed up by phone today to explain how hospice can assist in completion of these documents as well. Daughter stated they had already been contacted by hospice and have an appointment next week. Daughter appreciated follow up call and denied other needs.   Clinical Social Work interventions: ADR education   Loren Racer, [place] Worker [place]  [place] Phone: [phone] Fax: [phone]

## 2014-09-03 ENCOUNTER — Ambulatory Visit: Payer: Medicare HMO

## 2014-09-03 ENCOUNTER — Other Ambulatory Visit: Payer: Medicare HMO

## 2014-09-04 ENCOUNTER — Other Ambulatory Visit: Payer: Self-pay | Admitting: Medical Oncology

## 2014-09-04 ENCOUNTER — Telehealth: Payer: Self-pay | Admitting: Medical Oncology

## 2014-09-04 ENCOUNTER — Ambulatory Visit: Payer: Medicare HMO

## 2014-09-04 DIAGNOSIS — C3491 Malignant neoplasm of unspecified part of right bronchus or lung: Secondary | ICD-10-CM

## 2014-09-04 MED ORDER — HYDROCODONE-ACETAMINOPHEN 5-325 MG PO TABS: 1.0000 | ORAL_TABLET | Freq: Four times a day (QID) | ORAL | Status: AC | PRN

## 2014-09-04 NOTE — Telephone Encounter (Signed)
I faxed pain med rx to local pharmacy.

## 2014-09-04 NOTE — Telephone Encounter (Signed)
Vicodin rx faxed to pharmacy.

## 2014-09-05 ENCOUNTER — Ambulatory Visit: Payer: Medicare HMO

## 2014-09-06 ENCOUNTER — Ambulatory Visit: Payer: Medicare HMO

## 2014-09-10 ENCOUNTER — Other Ambulatory Visit: Payer: Medicare HMO

## 2014-09-10 ENCOUNTER — Other Ambulatory Visit: Payer: Self-pay | Admitting: Medical Oncology

## 2014-09-10 ENCOUNTER — Telehealth: Payer: Self-pay | Admitting: Medical Oncology

## 2014-09-10 DIAGNOSIS — C3491 Malignant neoplasm of unspecified part of right bronchus or lung: Secondary | ICD-10-CM

## 2014-09-10 MED ORDER — FENTANYL 25 MCG/HR TD PT72: 25.0000 ug | MEDICATED_PATCH | TRANSDERMAL | Status: AC

## 2014-09-10 NOTE — Telephone Encounter (Signed)
rx faxed to [facility] 

## 2014-09-10 NOTE — Progress Notes (Signed)
[facility] requested refill for Duragesic and pt desires to be DNR ,Per Chloe Snyder , he concurs with DNR and Dr Karie Georges signing order.[facility] nurse notified.rx faxed to [facility]

## 2014-09-17 ENCOUNTER — Other Ambulatory Visit: Payer: Medicare HMO

## 2014-09-24 ENCOUNTER — Other Ambulatory Visit: Payer: Medicare HMO

## 2014-09-27 ENCOUNTER — Ambulatory Visit: Payer: Medicare HMO

## 2014-12-10 ENCOUNTER — Telehealth: Payer: Self-pay | Admitting: Medical Oncology

## 2014-12-10 NOTE — Telephone Encounter (Signed)
[place] [facility] today

## 2015-01-15 ENCOUNTER — Telehealth: Payer: Self-pay | Admitting: *Deleted

## 2015-01-15 NOTE — Telephone Encounter (Signed)
[facility] called for mailing address.  Gave same, asked they send it attention HIM Dept.

## 2015-01-22 ENCOUNTER — Telehealth: Payer: Self-pay | Admitting: Internal Medicine

## 2015-01-22 NOTE — Telephone Encounter (Signed)
Received death certificate [birth year]38/[birth year]

## 2015-01-23 ENCOUNTER — Telehealth: Payer: Self-pay | Admitting: Internal Medicine

## 2015-01-23 NOTE — Telephone Encounter (Signed)
Mailed death certificate to [facility] Dept.01/23/15

## 2015-01-23 NOTE — Telephone Encounter (Signed)
Received message from HIM - patient died January 19, 2015. Chart marked accordingly.

## 2015-01-25 DEATH — deceased

## 2015-02-08 IMAGING — CT CT CHEST W/ CM
2 of 4 series · 15 of 36 positions shown, 18 images · IV contrast (OMNIPAQUE)
Comparison: Radiograph 07/25/2014 and earlier studies

CLINICAL DATA: Pleural effusion on right, Dec appetite x few
months; 54# wt loss x 3 mo; cough; sob; hypoxic 08/01/14; eval pl
effusion/ adenopathy seen on CT @ [HOSPITAL]; eval for underlying
malignancy; LBP X 2 yr

EXAM:
CT CHEST WITH CONTRAST
TECHNIQUE: Multidetector CT imaging of the chest was performed during
intravenous contrast administration.
CONTRAST:  80mL OMNIPAQUE IOHEXOL 300 MG/ML  SOLN

[Series 2: rtn chest with st · axial · 0.68mm/px · z∈[-319,-44]mm · 12 of 65 slices shown, 15 images]
[im 5/65  mediastinal]
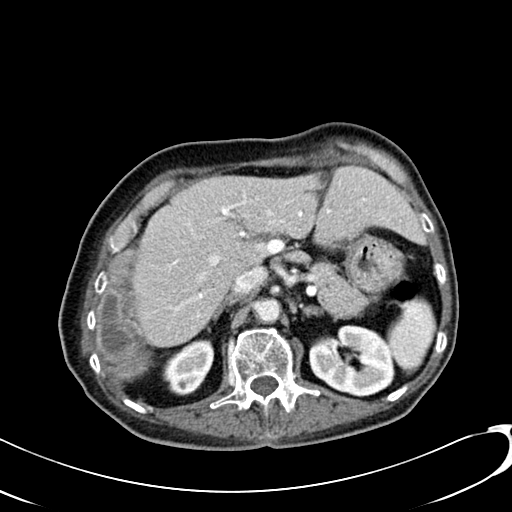
[im 5/65  lung]
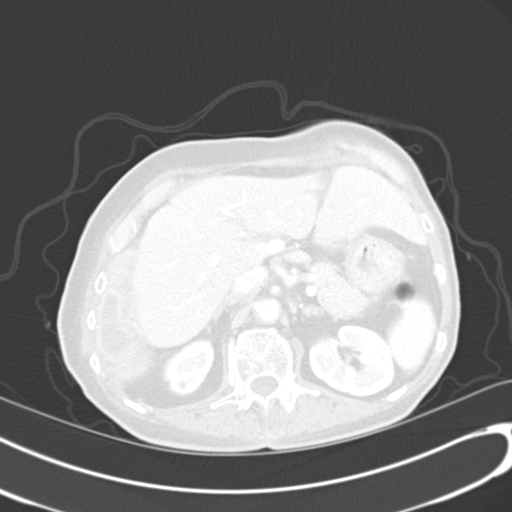
[im 10/65  lung]
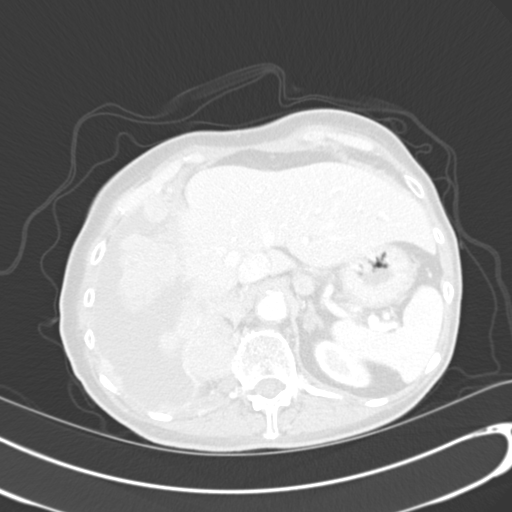
[im 14/65  lung]
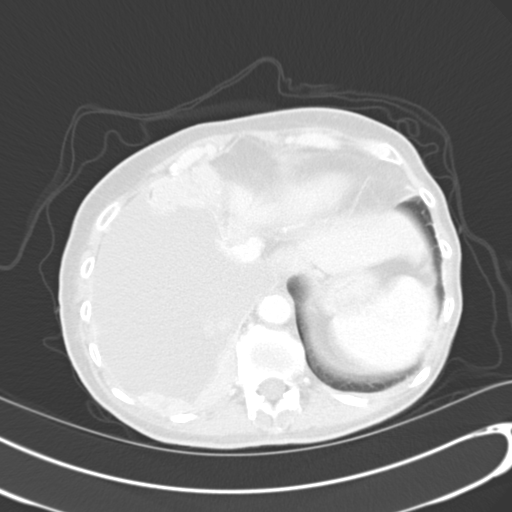
[im 19/65  lung]
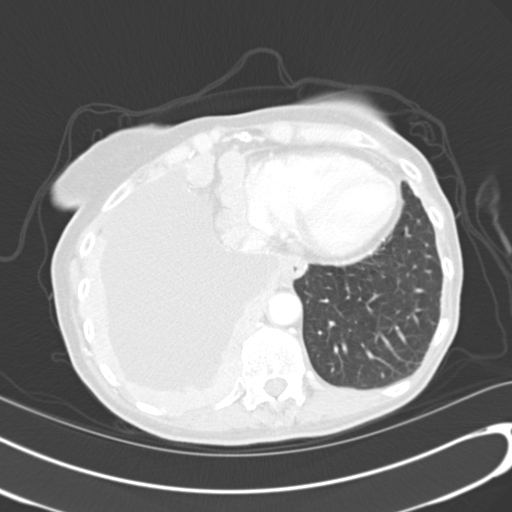
[im 23/65  mediastinal]
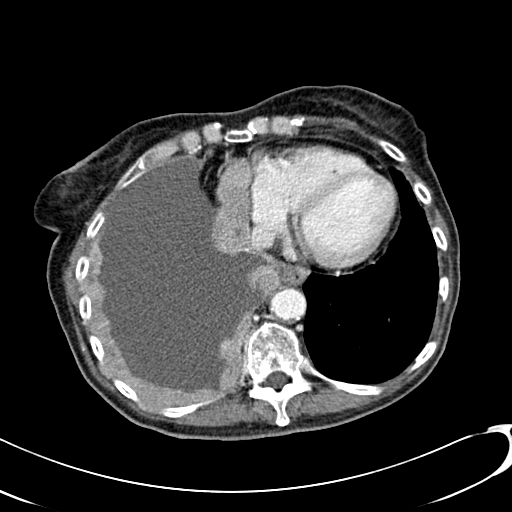
[im 23/65  lung]
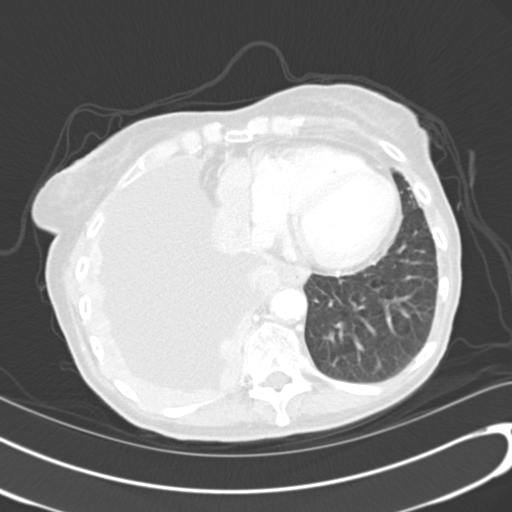
[im 28/65  lung]
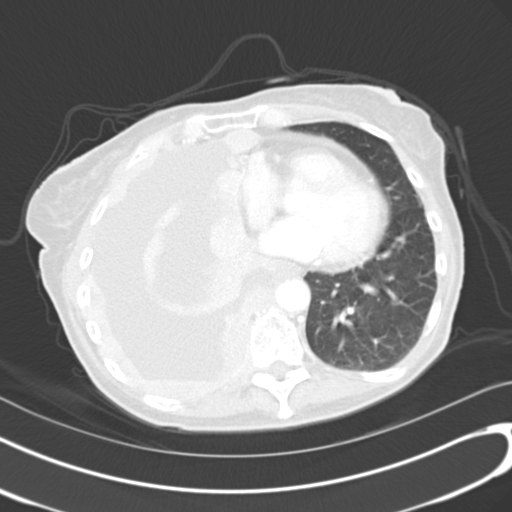
[im 37/65  lung]
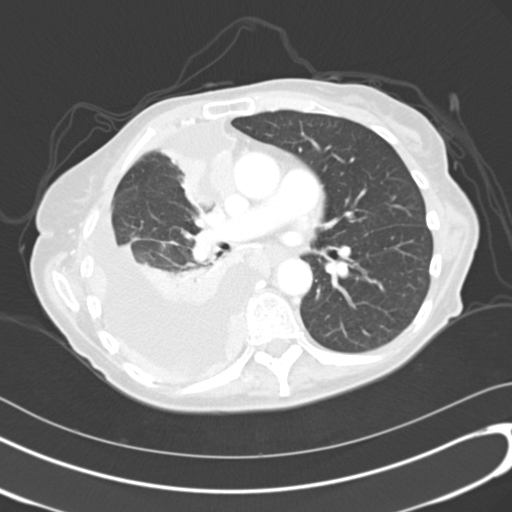
[im 42/65  lung]
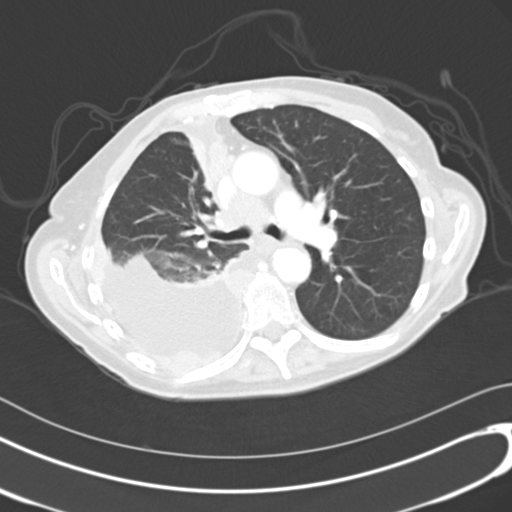
[im 46/65  mediastinal]
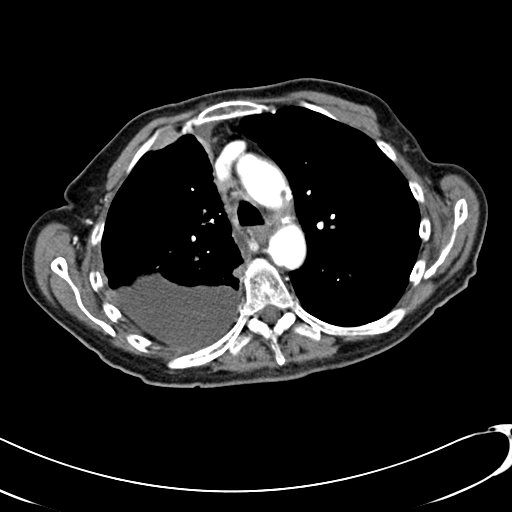
[im 46/65  lung]
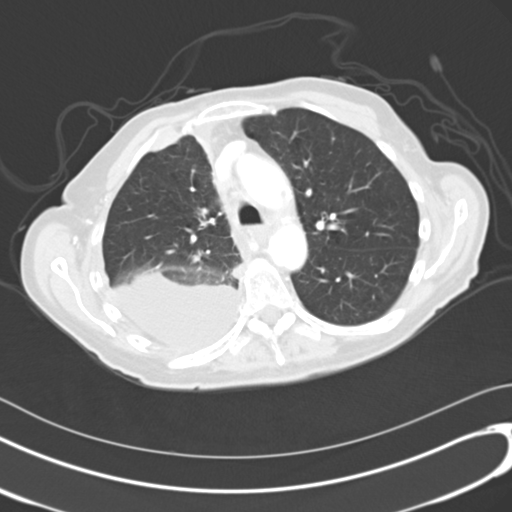
[im 51/65  lung]
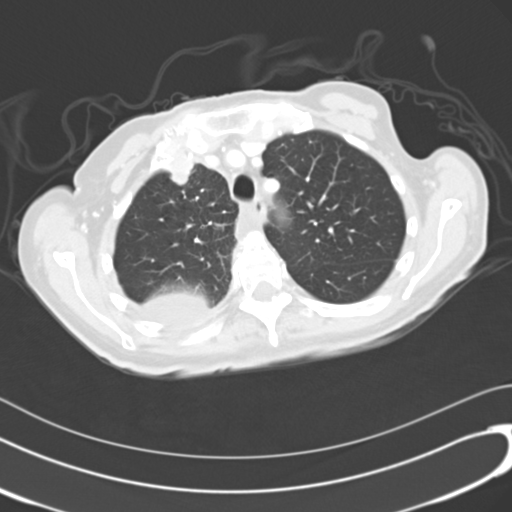
[im 55/65  lung]
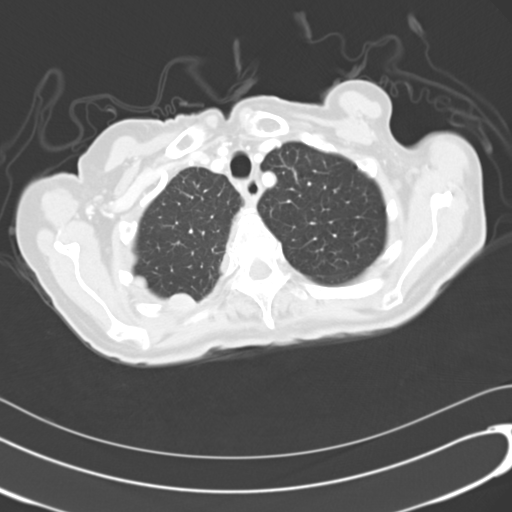
[im 60/65  lung]
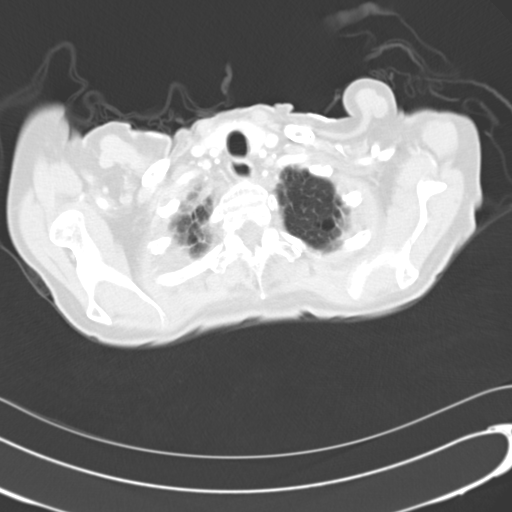

[Series 602: coronal · coronal · 0.68mm/px · 3 of 82 slices shown]
[im 17/82  lung]
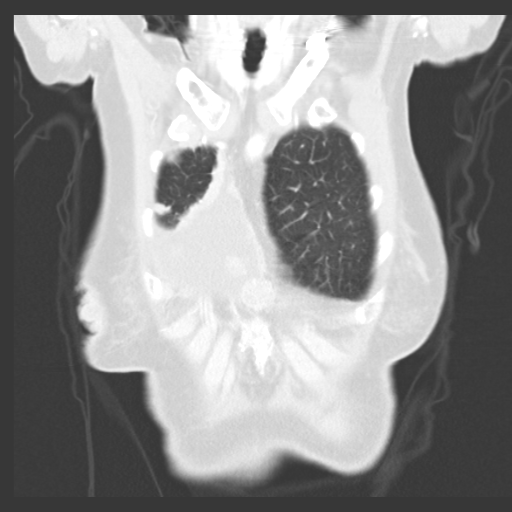
[im 33/82  lung]
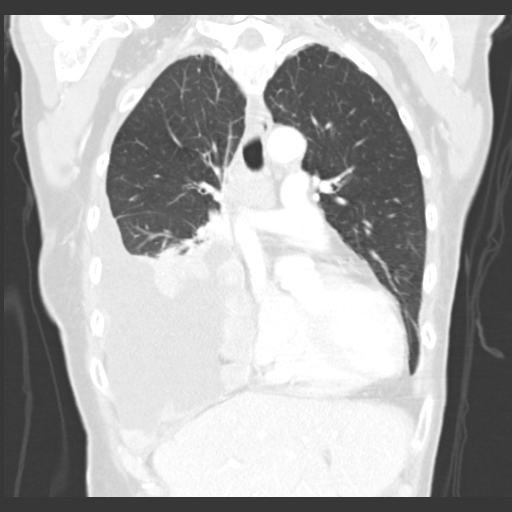
[im 49/82  lung]
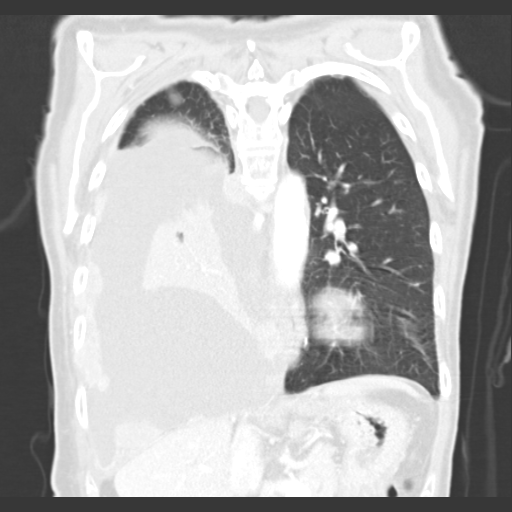

[15 of 36 positions shown; findings below may reference images not displayed]

FINDINGS: Moderate right pleural effusion with extensive nodular pleural
thickening and confluent masses measuring up 3.5 cm.

Mediastinal adenopathy, precarinal node measured up to 2 cm short
axis diameter. Confluent right pericardial lymph node mass up to
cm short axis diameter.

No pericardial or left pleural effusion. No pleural plaques on the
left.

Two small nodules in the right middle lobe measuring approximately 3
mm, images 27-28 sequence 5. There is extensive atelectasis
throughout much of the right lower lobe limiting parenchymal
evaluation. Emphysematous changes with subpleural blebs noted in
both lung apices.

Mild plaque in the aortic arch and descending aorta.

Mild compression fracture deformities of T10 and superior endplate
of L1, present since 10/27/2013.
IMPRESSION: 1. Extensive right pleural tumor with effusion. Consideration
includes mesothelioma versus extensive pleural metastatic disease.
2. Right pericardial and precarinal mediastinal adenopathy.

## 2015-02-16 NOTE — Consult Note (Signed)
PATIENT NAME:  Chloe Snyder, COZBY MR#:  [id number] DATE OF BIRTH:  [birth year]  DATE OF CONSULTATION:  07/27/2014  CONSULTING PHYSICIAN:  Gonzella Lex, MD  FOLLOW-UP NOTE: The patient is a 69 year old woman with major depression who was brought in for treatment because she has been refusing food, losing a lot of weight, looking generally sickly, refusing mental health treatment and not taking care of her health. Family has become increasingly concerned about her safety. My evaluation has been that she needs inpatient hospitalization and I had put in orders for admission. The nursing staff on our unit here have refused to admit her based on a belief that she is somehow not medically stable enough for admission here. I disagree with this, but they seem firm about it and have refused to admit her. Therefore I have had her referred to other facilities that have geriatric units. We have been advised at this point that [facility] would have a bed available at the earliest tomorrow. The patient is now stating she wants to be discharged and go home.   MENTAL STATUS EXAMINATION: Still has poor insight. She is ambivalent about her mood. Affect dysphoric. Thoughts slow and sluggish. Denies suicidal intent.   ASSESSMENT: The patient is still at high risk of dangerousness to self if not stabilized in a hospital. I do not feel it is comfortably safe to discharge her at this point. I have put her on involuntary commitment because of her refusal of cooperation with treatment and severe depression.   PLAN: For transfer hopefully to [facility].   DIAGNOSIS, PRINCIPAL AND PRIMARY:  AXIS I: Major depression, severe, recurrent.    ____________________________ Gonzella Lex, MD jtc:JT D: 07/27/2014 12:53:26 ET T: 07/27/2014 13:06:35 ET JOB#: [id number]  cc: Gonzella Lex, MD, <Dictator> Gonzella Lex MD ELECTRONICALLY SIGNED 08/02/2014 16:08
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
## 2016-04-13 ENCOUNTER — Other Ambulatory Visit: Payer: Self-pay | Admitting: Nurse Practitioner

## 2016-04-14 ENCOUNTER — Other Ambulatory Visit: Payer: Self-pay | Admitting: Nurse Practitioner
